# Patient Record
Sex: Female | Born: 1980 | Race: White | Hispanic: No | Marital: Married | Smoking: Former smoker
Health system: Southern US, Community
[De-identification: ages and names within clinical notes are randomized; demographics above are authoritative.]

## PROBLEM LIST (undated history)

## (undated) DIAGNOSIS — J45909 Unspecified asthma, uncomplicated: Secondary | ICD-10-CM

## (undated) DIAGNOSIS — R569 Unspecified convulsions: Secondary | ICD-10-CM

## (undated) DIAGNOSIS — Z87891 Personal history of nicotine dependence: Secondary | ICD-10-CM

## (undated) DIAGNOSIS — O021 Missed abortion: Secondary | ICD-10-CM

## (undated) DIAGNOSIS — Z973 Presence of spectacles and contact lenses: Secondary | ICD-10-CM

## (undated) DIAGNOSIS — Z8669 Personal history of other diseases of the nervous system and sense organs: Secondary | ICD-10-CM

## (undated) DIAGNOSIS — O034 Incomplete spontaneous abortion without complication: Secondary | ICD-10-CM

## (undated) DIAGNOSIS — R87629 Unspecified abnormal cytological findings in specimens from vagina: Secondary | ICD-10-CM

## (undated) DIAGNOSIS — K219 Gastro-esophageal reflux disease without esophagitis: Secondary | ICD-10-CM

## (undated) HISTORY — PX: LEEP: SHX91

## (undated) HISTORY — PX: WISDOM TOOTH EXTRACTION: SHX21

## (undated) HISTORY — DX: Incomplete spontaneous abortion without complication: O03.4

---

## 2002-08-06 ENCOUNTER — Other Ambulatory Visit: Admission: RE | Admit: 2002-08-06 | Discharge: 2002-08-06 | Payer: Self-pay | Admitting: Obstetrics and Gynecology

## 2005-12-06 ENCOUNTER — Ambulatory Visit: Payer: Self-pay | Admitting: Family Medicine

## 2006-02-28 ENCOUNTER — Ambulatory Visit: Payer: Self-pay | Admitting: Family Medicine

## 2006-09-16 ENCOUNTER — Ambulatory Visit: Payer: Self-pay | Admitting: Family Medicine

## 2009-08-18 ENCOUNTER — Ambulatory Visit (HOSPITAL_COMMUNITY): Admission: RE | Admit: 2009-08-18 | Discharge: 2009-08-18 | Payer: Self-pay | Admitting: Obstetrics and Gynecology

## 2009-11-22 ENCOUNTER — Inpatient Hospital Stay (HOSPITAL_COMMUNITY): Admission: AD | Admit: 2009-11-22 | Discharge: 2009-11-22 | Payer: Self-pay | Admitting: Obstetrics and Gynecology

## 2009-11-30 ENCOUNTER — Inpatient Hospital Stay (HOSPITAL_COMMUNITY): Admission: AD | Admit: 2009-11-30 | Discharge: 2009-11-30 | Payer: Self-pay | Admitting: Obstetrics & Gynecology

## 2009-12-02 ENCOUNTER — Ambulatory Visit: Payer: Self-pay | Admitting: Physician Assistant

## 2009-12-02 ENCOUNTER — Inpatient Hospital Stay (HOSPITAL_COMMUNITY): Admission: AD | Admit: 2009-12-02 | Discharge: 2009-12-02 | Payer: Self-pay | Admitting: Obstetrics and Gynecology

## 2009-12-04 ENCOUNTER — Ambulatory Visit (HOSPITAL_COMMUNITY): Admission: AD | Admit: 2009-12-04 | Discharge: 2009-12-04 | Payer: Self-pay | Admitting: Obstetrics and Gynecology

## 2009-12-05 ENCOUNTER — Inpatient Hospital Stay (HOSPITAL_COMMUNITY): Admission: AD | Admit: 2009-12-05 | Discharge: 2009-12-09 | Payer: Self-pay | Admitting: Obstetrics and Gynecology

## 2011-02-04 LAB — COMPREHENSIVE METABOLIC PANEL
ALT: 16 U/L (ref 0–35)
ALT: 16 U/L (ref 0–35)
ALT: 15 U/L (ref 0–35)
AST: 23 U/L (ref 0–37)
AST: 22 U/L (ref 0–37)
AST: 69 U/L — ABNORMAL HIGH (ref 0–37)
Albumin: 2.3 g/dL — ABNORMAL LOW (ref 3.5–5.2)
Albumin: 2.3 g/dL — ABNORMAL LOW (ref 3.5–5.2)
Albumin: 1.9 g/dL — ABNORMAL LOW (ref 3.5–5.2)
Albumin: 2 g/dL — ABNORMAL LOW (ref 3.5–5.2)
Alkaline Phosphatase: 207 U/L — ABNORMAL HIGH (ref 39–117)
Alkaline Phosphatase: 111 U/L (ref 39–117)
BUN: 10 mg/dL (ref 6–23)
BUN: 10 mg/dL (ref 6–23)
BUN: 13 mg/dL (ref 6–23)
BUN: 9 mg/dL (ref 6–23)
CO2: 21 mEq/L (ref 19–32)
CO2: 25 mEq/L (ref 19–32)
Calcium: 8.4 mg/dL (ref 8.4–10.5)
Calcium: 8.5 mg/dL (ref 8.4–10.5)
Chloride: 109 mEq/L (ref 96–112)
Chloride: 110 mEq/L (ref 96–112)
Creatinine, Ser: 0.74 mg/dL (ref 0.4–1.2)
Creatinine, Ser: 0.72 mg/dL (ref 0.4–1.2)
Creatinine, Ser: 0.85 mg/dL (ref 0.4–1.2)
Creatinine, Ser: 0.98 mg/dL (ref 0.4–1.2)
GFR calc Af Amer: >60 mL/min (ref >60)
GFR calc Af Amer: >60 mL/min (ref >60)
GFR calc Af Amer: >60 mL/min (ref >60)
GFR calc non Af Amer: >60 mL/min (ref >60)
GFR calc non Af Amer: >60 mL/min (ref >60)
GFR calc non Af Amer: >60 mL/min (ref >60)
Glucose, Bld: 110 mg/dL — ABNORMAL HIGH (ref 70–99)
Glucose, Bld: 75 mg/dL (ref 70–99)
Glucose, Bld: 85 mg/dL (ref 70–99)
Potassium: 4 mEq/L (ref 3.5–5.1)
Potassium: 3.8 mEq/L (ref 3.5–5.1)
Sodium: 135 mEq/L (ref 135–145)
Sodium: 136 mEq/L (ref 135–145)
Sodium: 137 mEq/L (ref 135–145)
Total Bilirubin: 0.2 mg/dL — ABNORMAL LOW (ref 0.3–1.2)
Total Bilirubin: 0.5 mg/dL (ref 0.3–1.2)
Total Protein: 5.7 g/dL — ABNORMAL LOW (ref 6.0–8.3)
Total Protein: 6.1 g/dL (ref 6.0–8.3)
Total Protein: 4.6 g/dL — ABNORMAL LOW (ref 6.0–8.3)
Total Protein: 4.8 g/dL — ABNORMAL LOW (ref 6.0–8.3)

## 2011-02-04 LAB — CBC
HCT: 23.5 % — ABNORMAL LOW (ref 36.0–46.0)
HCT: 24.6 % — ABNORMAL LOW (ref 36.0–46.0)
HCT: 31.6 % — ABNORMAL LOW (ref 36.0–46.0)
Hemoglobin: 10.6 g/dL — ABNORMAL LOW (ref 12.0–15.0)
Hemoglobin: 7.9 g/dL — ABNORMAL LOW (ref 12.0–15.0)
MCHC: 33.4 g/dL (ref 30.0–36.0)
MCHC: 33.5 g/dL (ref 30.0–36.0)
MCV: 88.4 fL (ref 78.0–100.0)
MCV: 88.8 fL (ref 78.0–100.0)
MCV: 89.4 fL (ref 78.0–100.0)
MCV: 89.6 fL (ref 78.0–100.0)
Platelets: 221 10*3/uL (ref 150–400)
Platelets: 239 10*3/uL (ref 150–400)
Platelets: 185 10*3/uL (ref 150–400)
RBC: 2.63 MIL/uL — ABNORMAL LOW (ref 3.87–5.11)
RBC: 3.56 MIL/uL — ABNORMAL LOW (ref 3.87–5.11)
RDW: 15.9 % — ABNORMAL HIGH (ref 11.5–15.5)
RDW: 16 % — ABNORMAL HIGH (ref 11.5–15.5)
RDW: 16.2 % — ABNORMAL HIGH (ref 11.5–15.5)
WBC: 10.6 10*3/uL — ABNORMAL HIGH (ref 4.0–10.5)
WBC: 6.8 10*3/uL (ref 4.0–10.5)

## 2011-02-04 LAB — URINALYSIS, ROUTINE W REFLEX MICROSCOPIC
Bilirubin Urine: NEGATIVE
Nitrite: NEGATIVE
Specific Gravity, Urine: 1.02 (ref 1.005–1.030)
Urobilinogen, UA: 0.2 mg/dL (ref 0.0–1.0)
pH: 6 (ref 5.0–8.0)

## 2011-02-04 LAB — URINE MICROSCOPIC-ADD ON

## 2011-02-04 LAB — URIC ACID: Uric Acid, Serum: 5.6 mg/dL (ref 2.4–7.0)

## 2011-02-04 LAB — RPR: RPR Ser Ql: NONREACTIVE

## 2011-02-04 LAB — LACTATE DEHYDROGENASE
LDH: 141 U/L (ref 94–250)
LDH: 149 U/L (ref 94–250)

## 2014-10-19 DIAGNOSIS — O021 Missed abortion: Secondary | ICD-10-CM

## 2014-10-19 HISTORY — DX: Missed abortion: O02.1

## 2014-11-16 ENCOUNTER — Encounter (HOSPITAL_COMMUNITY): Payer: Self-pay | Admitting: *Deleted

## 2014-11-16 NOTE — Progress Notes (Signed)
COMPLETED HX AND MED LIST WITH PT.  DUE TO WT LIMIT PER FACILITY POLICY , PT WILL NEED TO BE MOVED TO [facility].  LM AT OFFICE FOR MARY, OR SCHEDULER ABOUT POLICY AND ALSO PRE-OP ORDERS NEEDED. PT AWARE OF THIS AND CAN BE REACHED AT #[phone] WITH FURTHER INSTRUCTIONS.

## 2014-11-17 ENCOUNTER — Encounter (HOSPITAL_COMMUNITY): Payer: Self-pay | Admitting: *Deleted

## 2014-11-17 ENCOUNTER — Encounter (HOSPITAL_COMMUNITY)
Admission: RE | Disposition: A | Discharge status: Home / Self Care | Payer: Self-pay | Source: Ambulatory Visit | Attending: Obstetrics and Gynecology

## 2014-11-17 ENCOUNTER — Ambulatory Visit (HOSPITAL_COMMUNITY): Payer: BC Managed Care – PPO | Admitting: Anesthesiology

## 2014-11-17 ENCOUNTER — Ambulatory Visit (HOSPITAL_COMMUNITY)
Admission: RE | Admit: 2014-11-17 | Discharge: 2014-11-17 | Disposition: A | Discharge status: Home / Self Care | Payer: BC Managed Care – PPO | Source: Ambulatory Visit | Attending: Obstetrics and Gynecology | Admitting: Obstetrics and Gynecology

## 2014-11-17 DIAGNOSIS — O034 Incomplete spontaneous abortion without complication: Secondary | ICD-10-CM

## 2014-11-17 DIAGNOSIS — J45909 Unspecified asthma, uncomplicated: Secondary | ICD-10-CM | POA: Diagnosis not present

## 2014-11-17 DIAGNOSIS — O021 Missed abortion: Secondary | ICD-10-CM | POA: Diagnosis present

## 2014-11-17 DIAGNOSIS — K219 Gastro-esophageal reflux disease without esophagitis: Secondary | ICD-10-CM | POA: Insufficient documentation

## 2014-11-17 DIAGNOSIS — Z6841 Body Mass Index (BMI) 40.0 and over, adult: Secondary | ICD-10-CM | POA: Insufficient documentation

## 2014-11-17 HISTORY — DX: Incomplete spontaneous abortion without complication: O03.4

## 2014-11-17 HISTORY — DX: Missed abortion: O02.1

## 2014-11-17 HISTORY — DX: Gastro-esophageal reflux disease without esophagitis: K21.9

## 2014-11-17 HISTORY — DX: Presence of spectacles and contact lenses: Z97.3

## 2014-11-17 HISTORY — DX: Personal history of other diseases of the nervous system and sense organs: Z86.69

## 2014-11-17 HISTORY — PX: DILATION AND EVACUATION: SHX1459

## 2014-11-17 LAB — CBC
HEMATOCRIT: 41.3 % (ref 36.0–46.0)
HEMOGLOBIN: 13.8 g/dL (ref 12.0–15.0)
MCH: 28.8 pg (ref 26.0–34.0)
MCHC: 33.4 g/dL (ref 30.0–36.0)
MCV: 86 fL (ref 78.0–100.0)
Platelets: 232 10*3/uL (ref 150–400)
RBC: 4.8 MIL/uL (ref 3.87–5.11)
RDW: 13.5 % (ref 11.5–15.5)
WBC: 8.9 10*3/uL (ref 4.0–10.5)

## 2014-11-17 LAB — ABO/RH: ABO/RH(D): A NEG

## 2014-11-17 SURGERY — DILATION AND EVACUATION, UTERUS
Anesthesia: Choice

## 2014-11-17 SURGERY — DILATION AND EVACUATION, UTERUS
Anesthesia: General | Site: Vagina

## 2014-11-17 MED ORDER — MIDAZOLAM HCL 2 MG/2ML IJ SOLN
INTRAMUSCULAR | Status: AC
  Filled 2014-11-17: qty 2

## 2014-11-17 MED ORDER — FENTANYL CITRATE 0.05 MG/ML IJ SOLN
INTRAMUSCULAR | Status: AC
  Filled 2014-11-17: qty 2

## 2014-11-17 MED ORDER — PROPOFOL 10 MG/ML IV BOLUS
INTRAVENOUS | Status: DC | PRN
  Administered 2014-11-17: 200 mg via INTRAVENOUS

## 2014-11-17 MED ORDER — CHLOROPROCAINE HCL 1 % IJ SOLN
INTRAMUSCULAR | Status: AC
  Filled 2014-11-17: qty 30

## 2014-11-17 MED ORDER — DEXAMETHASONE SODIUM PHOSPHATE 10 MG/ML IJ SOLN
INTRAMUSCULAR | Status: DC | PRN
  Administered 2014-11-17: 10 mg via INTRAVENOUS

## 2014-11-17 MED ORDER — LIDOCAINE HCL (CARDIAC) 20 MG/ML IV SOLN
INTRAVENOUS | Status: DC | PRN
  Administered 2014-11-17: 100 mg via INTRAVENOUS

## 2014-11-17 MED ORDER — FAMOTIDINE 20 MG PO TABS
ORAL_TABLET | ORAL | Status: AC
  Administered 2014-11-17: 20 mg via ORAL
  Filled 2014-11-17: qty 1

## 2014-11-17 MED ORDER — FAMOTIDINE 20 MG PO TABS
20.0000 mg | ORAL_TABLET | Freq: Once | ORAL | Status: AC
  Administered 2014-11-17: 20 mg via ORAL

## 2014-11-17 MED ORDER — CHLOROPROCAINE HCL 1 % IJ SOLN
INTRAMUSCULAR | Status: DC | PRN
  Administered 2014-11-17: 20 mL

## 2014-11-17 MED ORDER — KETOROLAC TROMETHAMINE 30 MG/ML IJ SOLN
INTRAMUSCULAR | Status: DC | PRN
  Administered 2014-11-17: 30 mg via INTRAVENOUS

## 2014-11-17 MED ORDER — RHO D IMMUNE GLOBULIN 1500 UNIT/2ML IJ SOSY
300.0000 ug | PREFILLED_SYRINGE | Freq: Once | INTRAMUSCULAR | Status: AC
  Administered 2014-11-17: 300 ug via INTRAVENOUS
  Filled 2014-11-17: qty 2

## 2014-11-17 MED ORDER — CLINDAMYCIN PHOSPHATE 900 MG/50ML IV SOLN
900.0000 mg | Freq: Once | INTRAVENOUS | Status: AC
  Administered 2014-11-17: 900 mg via INTRAVENOUS
  Filled 2014-11-17: qty 50

## 2014-11-17 MED ORDER — CIPROFLOXACIN IN D5W 400 MG/200ML IV SOLN
400.0000 mg | INTRAVENOUS | Status: AC
  Administered 2014-11-17: 400 mg via INTRAVENOUS
  Filled 2014-11-17: qty 200

## 2014-11-17 MED ORDER — SCOPOLAMINE 1 MG/3DAYS TD PT72
MEDICATED_PATCH | TRANSDERMAL | Status: AC
  Administered 2014-11-17: 1.5 mg via TRANSDERMAL
  Filled 2014-11-17: qty 1

## 2014-11-17 MED ORDER — LACTATED RINGERS IV SOLN
INTRAVENOUS | Status: DC
  Administered 2014-11-17: 13:00:00 via INTRAVENOUS

## 2014-11-17 MED ORDER — SCOPOLAMINE 1 MG/3DAYS TD PT72
1.0000 | MEDICATED_PATCH | TRANSDERMAL | Status: DC
  Administered 2014-11-17: 1.5 mg via TRANSDERMAL

## 2014-11-17 MED ORDER — FENTANYL CITRATE 0.05 MG/ML IJ SOLN
INTRAMUSCULAR | Status: DC | PRN
  Administered 2014-11-17 (×2): 100 ug via INTRAVENOUS

## 2014-11-17 MED ORDER — OXYCODONE-ACETAMINOPHEN 7.5-325 MG PO TABS: 1.0000 | ORAL_TABLET | ORAL | Status: DC | PRN

## 2014-11-17 SURGICAL SUPPLY — 17 items
CLOTH BEACON ORANGE TIMEOUT ST (SAFETY) ×3 IMPLANT
DECANTER SPIKE VIAL GLASS SM (MISCELLANEOUS) ×3 IMPLANT
GLOVE BIO SURGEON STRL SZ7 (GLOVE) ×3 IMPLANT
GOWN STRL REUS W/TWL LRG LVL3 (GOWN DISPOSABLE) ×6 IMPLANT
KIT BERKELEY 1ST TRIMESTER 3/8 (MISCELLANEOUS) ×3 IMPLANT
NDL SPNL 20GX3.5 QUINCKE YW (NEEDLE) ×1 IMPLANT
NEEDLE SPNL 20GX3.5 QUINCKE YW (NEEDLE) ×3 IMPLANT
NS IRRIG 1000ML POUR BTL (IV SOLUTION) ×3 IMPLANT
PACK VAGINAL MINOR WOMEN LF (CUSTOM PROCEDURE TRAY) ×3 IMPLANT
PAD OB MATERNITY 4.3X12.25 (PERSONAL CARE ITEMS) ×3 IMPLANT
PAD PREP 24X48 CUFFED NSTRL (MISCELLANEOUS) ×3 IMPLANT
SET BERKELEY SUCTION TUBING (SUCTIONS) ×3 IMPLANT
TOWEL OR 17X24 6PK STRL BLUE (TOWEL DISPOSABLE) ×6 IMPLANT
VACURETTE 10 RIGID CVD (CANNULA) IMPLANT
VACURETTE 7MM CVD STRL WRAP (CANNULA) IMPLANT
VACURETTE 8 RIGID CVD (CANNULA) ×2 IMPLANT
VACURETTE 9 RIGID CVD (CANNULA) IMPLANT

## 2014-11-17 NOTE — Anesthesia Preprocedure Evaluation (Addendum)
Anesthesia Evaluation  Patient identified by MRN, date of birth, ID band Patient awake    Reviewed: Allergy & Precautions, H&P , NPO status , Patient's Chart, lab work & pertinent test results  History of Anesthesia Complications Negative for: history of anesthetic complications  Airway Mallampati: III  TM Distance: >3 FB Neck ROM: Full    Dental no notable dental hx. (+) Dental Advisory Given   Pulmonary asthma ,  breath sounds clear to auscultation  Pulmonary exam normal       Cardiovascular negative cardio ROS  Rhythm:Regular Rate:Normal     Neuro/Psych negative neurological ROS  negative psych ROS   GI/Hepatic Neg liver ROS, GERD-  Medicated and Controlled,  Endo/Other  Morbid obesity  Renal/GU negative Renal ROS  negative genitourinary   Musculoskeletal negative musculoskeletal ROS (+)   Abdominal (+) + obese,   Peds negative pediatric ROS (+)  Hematology negative hematology ROS (+)   Anesthesia Other Findings   Reproductive/Obstetrics negative OB ROS                            Anesthesia Physical Anesthesia Plan  ASA: III  Anesthesia Plan: General   Post-op Pain Management:    Induction: Intravenous  Airway Management Planned: LMA  Additional Equipment:   Intra-op Plan:   Post-operative Plan: Extubation in OR  Informed Consent: I have reviewed the patients History and Physical, chart, labs and discussed the procedure including the risks, benefits and alternatives for the proposed anesthesia with the patient or authorized representative who has indicated his/her understanding and acceptance.   Dental advisory given  Plan Discussed with: CRNA  Anesthesia Plan Comments:         Anesthesia Quick Evaluation

## 2014-11-17 NOTE — H&P (Signed)
  History and physical exam unchanged 

## 2014-11-17 NOTE — Anesthesia Procedure Notes (Signed)
Procedure Name: LMA Insertion Date/Time: 11/17/2014 1:16 PM Performed by: Adin Lariccia, Jannet AskewHARLESETTA [name] Pre-anesthesia Checklist: Patient identified, Emergency Drugs available, Suction available, Patient being monitored and Timeout performed Patient Re-evaluated:Patient Re-evaluated prior to inductionOxygen Delivery Method: Circle system utilized Preoxygenation: Pre-oxygenation with 100% oxygen Intubation Type: IV induction LMA: LMA inserted LMA Size: 4.0 Number of attempts: 1 Dental Injury: Teeth and Oropharynx as per pre-operative assessment

## 2014-11-17 NOTE — Brief Op Note (Signed)
11/17/2014  1:44 PM  PATIENT:  Veronica [name]  33 y.o. female  PRE-OPERATIVE DIAGNOSIS:  MISSED AB  POST-OPERATIVE DIAGNOSIS:  missed abortion  PROCEDURE:  Procedure(s): DILATATION AND EVACUATION (N/A)  SURGEON:  Surgeon(s) and Role:    * Juluis MireJohn S Naeemah Jasmer, MD - Primary  PHYSICIAN ASSISTANT:   ASSISTANTS: none   ANESTHESIA:   general and paracervical block  EBL:     BLOOD ADMINISTERED:none  DRAINS: none   LOCAL MEDICATIONS USED:  MARCAINE     SPECIMEN:  Source of Specimen:  products of conception  DISPOSITION OF SPECIMEN:  PATHOLOGY  COUNTS:  YES  TOURNIQUET:  * No tourniquets in log *  DICTATION: .Other Dictation: Dictation Number [id number]  PLAN OF CARE: Discharge to home after PACU  PATIENT DISPOSITION:  PACU - hemodynamically stable.   Delay start of Pharmacological VTE agent (>24hrs) due to surgical blood loss or risk of bleeding: not applicable

## 2014-11-17 NOTE — H&P (Signed)
Veronica RedderJennifer R [name] is an 33 y.o. female. Presented at the office yesterday with vaginal bleeding at 9 weeks.  Sonogram c/w nonviable fetus.  CRL c/w 6.2 weeks.  Comes in for dilation and evacuation.  Pertinent Gynecological History: Menses: regular every month without intermenstrual spotting Bleeding: na Contraception: none DES exposure: denies Blood transfusions: none Sexually transmitted diseases: no past history Previous GYN Procedures: LEEP of the cervix and prior cesearean section  Last mammogram: na Date: na  Last pap: normal Date: 2014 OB History: G2, P1   Menstrual History: Menarche age: [age]  Patient's last menstrual period was 08/28/2014 (exact date).    Past Medical History  Diagnosis Date  . History of seizures as a child     FEBRILE--  NONE SINCE  . GERD (gastroesophageal reflux disease)   . Wears glasses   . Missed ab     Past Surgical History  Procedure Laterality Date  . Cesarean section  12-05-2009    History reviewed. No pertinent family history.  Social History:  reports that she has never smoked. She has never used smokeless tobacco. She reports that she does not drink alcohol or use illicit drugs.  Allergies:  Allergies  Allergen Reactions  . Penicillins Swelling    No prescriptions prior to admission    Review of Systems  All other systems reviewed and are negative.   Height 5\' 8"  (1.727 m), weight 168.284 kg (371 lb), last menstrual period 08/28/2014. Physical Exam  Constitutional: She is oriented to person, place, and time. She appears well-developed and well-nourished.  HENT:  Head: Normocephalic.  Eyes: Conjunctivae and EOM are normal. Pupils are equal, round, and reactive to light.  Cardiovascular: Normal rate, regular rhythm and normal heart sounds.   Respiratory: Effort normal and breath sounds normal.  GI: Soft. Bowel sounds are normal.  Genitourinary:  Minimal bleeding uterus 8 weeks in size  Neurological: She is alert and  oriented to person, place, and time.    No results found for this or any previous visit (from the past 24 hour(s)).  No results found.  Assessment/Plan: Nonviable first trimester pregnancy Blood type A neg. Precede with dilation and curretage.  Risk discussed including.  Infection.  Hemorrhage that could require transfusions with the risk of AID's or hepatitis.  Excessive bleeding could require hysterectomy.  Risk of perforation with injury to adjacent organs.  This could require exploratory surgery.  Risk of DVT and PE.  Patient expresses understanding of indications and risks.  Will require RHO GAM.  Sky Primo S 11/17/2014, 8:55 AM

## 2014-11-17 NOTE — Anesthesia Postprocedure Evaluation (Signed)
  Anesthesia Post-op Note  Patient: Veronica [name]  Procedure(s) Performed: Procedure(s): DILATATION AND EVACUATION (N/A)  Patient Location: PACU  Anesthesia Type:General  Level of Consciousness: awake, alert  and oriented  Airway and Oxygen Therapy: Patient Spontanous Breathing  Post-op Pain: none  Post-op Assessment: Post-op Vital signs reviewed, Patient's Cardiovascular Status Stable, Respiratory Function Stable, Patent Airway, No signs of Nausea or vomiting and Pain level controlled  Post-op Vital Signs: Reviewed and stable  Last Vitals:  Filed Vitals:   11/17/14 1400  BP: 135/74  Pulse: 80  Temp:   Resp: 27    Complications: No apparent anesthesia complications

## 2014-11-17 NOTE — Transfer of Care (Signed)
Immediate Anesthesia Transfer of Care Note  Patient: Veronica [name]  Procedure(s) Performed: Procedure(s): DILATATION AND EVACUATION (N/A)  Patient Location: PACU  Anesthesia Type:General  Level of Consciousness: awake, alert  and oriented  Airway & Oxygen Therapy: Patient Spontanous Breathing and Patient connected to nasal cannula oxygen  Post-op Assessment: Report given to PACU RN and Post -op Vital signs reviewed and stable  Post vital signs: Reviewed and stable  Complications: No apparent anesthesia complications

## 2014-11-17 NOTE — Op Note (Signed)
NAMDorothey Baseman:  [name], Veronica            ACCOUNT NO.:  [id number]  MEDICAL RECORD NO.:  [id number]  LOCATION:  [facility]                          FACILITY:  [facility]  PHYSICIAN:  Juluis MireJohn S. Javante [name], M.D.   DATE OF BIRTH:  1981-04-01  DATE OF PROCEDURE:  11/17/2014 DATE OF DISCHARGE:  11/17/2014                              OPERATIVE REPORT   PREOPERATIVE DIAGNOSIS:  Nonviable first trimester pregnancy.  POSTOPERATIVE DIAGNOSIS:  Nonviable first trimester pregnancy.  OPERATIVE PROCEDURE:  Paracervical block with dilation and evacuation.  SURGEON:  Juluis MireJohn S. Elai [name], M.D.  ANESTHESIA:  Paracervical block with general.  BLOOD LOSS:  Minimal.  PACKS AND DRAINS:  None.  INTRAOPERATIVE BLOOD PLACED:  None.  COMPLICATIONS:  None.  INDICATIONS:  Dictated in history and physical.  PROCEDURE IN DETAIL:  As follows; the patient was taken to OR and placed supine position.  After satisfactory level of general anesthesia obtained, the patient was placed in the dorsal lithotomy position in the [place] stirrups.  Perineum and vagina were prepped out with Betadine and draped in sterile field.  A speculum was placed in vaginal vault.  The cervix was grasped with single-tooth tenaculum.  Paracervical block of 1% Nesacaine was instituted.  Uterus sounded to approximately 10 cm. Cervix serially dilated to a size 27 Pratt dilator.  Size 8 curved suction curette was introduced.  Intrauterine contents were removed using suction curetting.  This was continued until no additional tissue was obtained.  We then sharply curetted.  Repeat suction curetting obtained additional tissue and repeated sharp curetting and felt that all areas of the uterus were clear by its gritty feel.  Repeat suction curetting revealed no additional tissue.  The uterus was contracting down well.  Bleeding was minimal.  There were no signs of perforation.  At this point in time, single-tooth tenaculum and speculum then removed.  The patient was  taken out the dorsal lithotomy position.  Once alert and extubated, transferred to recovery room in good condition.  Sponge, instrument, and needle count were correct by circulating nurse.     Juluis MireJohn S. [name], M.D.     JSM/MEDQ  D:  11/17/2014  T:  11/17/2014  Job:  [id number]

## 2014-11-17 NOTE — Discharge Instructions (Signed)

## 2014-11-18 ENCOUNTER — Encounter (HOSPITAL_COMMUNITY): Payer: Self-pay | Admitting: Obstetrics and Gynecology

## 2014-11-18 LAB — RH IG WORKUP (INCLUDES ABO/RH)
ABO/RH(D): A NEG
Antibody Screen: NEGATIVE
Gestational Age(Wks): 9
Unit division: 0

## 2015-02-24 LAB — OB RESULTS CONSOLE HEPATITIS B SURFACE ANTIGEN: HEP B S AG: NEGATIVE

## 2015-02-24 LAB — OB RESULTS CONSOLE RPR
RPR: NONREACTIVE
RPR: NONREACTIVE

## 2015-02-24 LAB — OB RESULTS CONSOLE ABO/RH: RH TYPE: NEGATIVE

## 2015-02-24 LAB — OB RESULTS CONSOLE HIV ANTIBODY (ROUTINE TESTING)
HIV: NONREACTIVE
HIV: NONREACTIVE

## 2015-02-24 LAB — OB RESULTS CONSOLE GC/CHLAMYDIA
CHLAMYDIA, DNA PROBE: NEGATIVE
Chlamydia: NEGATIVE
Gonorrhea: NEGATIVE
Gonorrhea: NEGATIVE

## 2015-02-24 LAB — OB RESULTS CONSOLE ANTIBODY SCREEN: Antibody Screen: NEGATIVE

## 2015-02-24 LAB — OB RESULTS CONSOLE RUBELLA ANTIBODY, IGM: RUBELLA: IMMUNE

## 2015-03-09 ENCOUNTER — Other Ambulatory Visit: Payer: Self-pay | Admitting: Obstetrics and Gynecology

## 2015-03-10 LAB — CYTOLOGY - PAP

## 2015-09-21 NOTE — H&P (Signed)
Veronica RedderJennifer R [name] is a 34 y.o. female G3 P1 @ 30+ weeks presenting for repeat c-section and steriliztion . History OB History    Gravida Para Term Preterm AB TAB SAB Ectopic Multiple Living   1              Past Medical History  Diagnosis Date  . History of seizures as a child     FEBRILE--  NONE SINCE  . GERD (gastroesophageal reflux disease)   . Wears glasses   . Missed ab    Past Surgical History  Procedure Laterality Date  . Cesarean section  12-05-2009  . Dilation and evacuation N/A 11/17/2014    Procedure: DILATATION AND EVACUATION;  Surgeon: Juluis MireJohn S McComb, MD;  Location: [facility];  Service: Gynecology;  Laterality: N/A;   Family History: family history is not on file. Social History:  reports that she has never smoked. She has never used smokeless tobacco. She reports that she does not drink alcohol or use illicit drugs.   Prenatal Transfer Tool  Maternal Diabetes: No Genetic Screening: Declined Maternal Ultrasounds/Referrals: Normal Fetal Ultrasounds or other Referrals:  None Maternal Substance Abuse:  No Significant Maternal Medications:  None Significant Maternal Lab Results:  None Other Comments:  None  ROS    Last menstrual period 08/28/2014. Exam Physical Exam  Prenatal labs: ABO, Rh: --/--/A NEG, A NEG (12/30 1430) Antibody: NEG (12/30 1430) Rubella:   RPR:    HBsAg:    HIV:    GBS:     Assessment/Plan: Previous c-section.  Desires rpt with sterilzation.  Plan for rpt c-section w/ BPS   Natan Hartog 09/21/2015, 1:32 PM

## 2015-09-30 ENCOUNTER — Encounter (HOSPITAL_COMMUNITY): Payer: Self-pay

## 2015-09-30 ENCOUNTER — Encounter (HOSPITAL_COMMUNITY)
Admission: AD | Disposition: A | Discharge status: Home / Self Care | Payer: Self-pay | Source: Ambulatory Visit | Attending: Obstetrics and Gynecology

## 2015-09-30 ENCOUNTER — Encounter (HOSPITAL_COMMUNITY): Payer: Self-pay | Admitting: *Deleted

## 2015-09-30 ENCOUNTER — Inpatient Hospital Stay (HOSPITAL_COMMUNITY)
Admission: AD | Admit: 2015-09-30 | Discharge: 2015-10-02 | DRG: 765 | Disposition: A | Discharge status: Home / Self Care | Payer: BLUE CROSS/BLUE SHIELD | Source: Ambulatory Visit | Attending: Obstetrics and Gynecology | Admitting: Obstetrics and Gynecology

## 2015-09-30 ENCOUNTER — Inpatient Hospital Stay (HOSPITAL_COMMUNITY): Payer: BLUE CROSS/BLUE SHIELD | Admitting: Anesthesiology

## 2015-09-30 DIAGNOSIS — O9952 Diseases of the respiratory system complicating childbirth: Secondary | ICD-10-CM | POA: Diagnosis present

## 2015-09-30 DIAGNOSIS — Z302 Encounter for sterilization: Secondary | ICD-10-CM | POA: Diagnosis not present

## 2015-09-30 DIAGNOSIS — Z8249 Family history of ischemic heart disease and other diseases of the circulatory system: Secondary | ICD-10-CM | POA: Diagnosis not present

## 2015-09-30 DIAGNOSIS — Z87891 Personal history of nicotine dependence: Secondary | ICD-10-CM | POA: Diagnosis not present

## 2015-09-30 DIAGNOSIS — O99214 Obesity complicating childbirth: Secondary | ICD-10-CM | POA: Diagnosis present

## 2015-09-30 DIAGNOSIS — Z3A38 38 weeks gestation of pregnancy: Secondary | ICD-10-CM

## 2015-09-30 DIAGNOSIS — J45909 Unspecified asthma, uncomplicated: Secondary | ICD-10-CM | POA: Diagnosis present

## 2015-09-30 DIAGNOSIS — Z6841 Body Mass Index (BMI) 40.0 and over, adult: Secondary | ICD-10-CM

## 2015-09-30 DIAGNOSIS — Z822 Family history of deafness and hearing loss: Secondary | ICD-10-CM | POA: Diagnosis not present

## 2015-09-30 DIAGNOSIS — Z809 Family history of malignant neoplasm, unspecified: Secondary | ICD-10-CM

## 2015-09-30 DIAGNOSIS — Z833 Family history of diabetes mellitus: Secondary | ICD-10-CM | POA: Diagnosis not present

## 2015-09-30 DIAGNOSIS — Z98891 History of uterine scar from previous surgery: Secondary | ICD-10-CM

## 2015-09-30 DIAGNOSIS — O34211 Maternal care for low transverse scar from previous cesarean delivery: Secondary | ICD-10-CM | POA: Diagnosis present

## 2015-09-30 HISTORY — DX: Unspecified convulsions: R56.9

## 2015-09-30 HISTORY — DX: Unspecified abnormal cytological findings in specimens from vagina: R87.629

## 2015-09-30 HISTORY — DX: Personal history of nicotine dependence: Z87.891

## 2015-09-30 HISTORY — DX: Unspecified asthma, uncomplicated: J45.909

## 2015-09-30 LAB — CBC
HEMATOCRIT: 36.1 % (ref 36.0–46.0)
Hemoglobin: 11.6 g/dL — ABNORMAL LOW (ref 12.0–15.0)
MCH: 28.6 pg (ref 26.0–34.0)
MCHC: 32.1 g/dL (ref 30.0–36.0)
MCV: 89.1 fL (ref 78.0–100.0)
PLATELETS: 213 10*3/uL (ref 150–400)
RBC: 4.05 MIL/uL (ref 3.87–5.11)
RDW: 14.9 % (ref 11.5–15.5)
WBC: 14 10*3/uL — ABNORMAL HIGH (ref 4.0–10.5)

## 2015-09-30 LAB — TYPE AND SCREEN
ABO/RH(D): A NEG
Antibody Screen: NEGATIVE

## 2015-09-30 SURGERY — Surgical Case
Anesthesia: Spinal

## 2015-09-30 MED ORDER — KETOROLAC TROMETHAMINE 30 MG/ML IJ SOLN: 30.0000 mg | Freq: Once | INTRAMUSCULAR | Status: AC | PRN

## 2015-09-30 MED ORDER — BUPIVACAINE IN DEXTROSE 0.75-8.25 % IT SOLN
INTRATHECAL | Status: DC | PRN
  Administered 2015-09-30: 1.2 mL via INTRATHECAL

## 2015-09-30 MED ORDER — OXYTOCIN 10 UNIT/ML IJ SOLN
40.0000 [IU] | INTRAVENOUS | Status: DC | PRN
  Administered 2015-09-30: 40 [IU] via INTRAVENOUS

## 2015-09-30 MED ORDER — ONDANSETRON HCL 4 MG/2ML IJ SOLN
INTRAMUSCULAR | Status: DC | PRN
Start: 2015-09-30 — End: 2015-09-30
  Administered 2015-09-30: 4 mg via INTRAVENOUS

## 2015-09-30 MED ORDER — MEPERIDINE HCL 25 MG/ML IJ SOLN: 6.2500 mg | INTRAMUSCULAR | Status: DC | PRN

## 2015-09-30 MED ORDER — BUPIVACAINE IN DEXTROSE 0.75-8.25 % IT SOLN
INTRATHECAL | Status: AC
  Filled 2015-09-30: qty 2

## 2015-09-30 MED ORDER — DEXAMETHASONE SODIUM PHOSPHATE 4 MG/ML IJ SOLN
INTRAMUSCULAR | Status: DC | PRN
  Administered 2015-09-30: 4 mg via INTRAVENOUS

## 2015-09-30 MED ORDER — PHENYLEPHRINE 8 MG IN D5W 100 ML (0.08MG/ML) PREMIX OPTIME
INJECTION | INTRAVENOUS | Status: DC | PRN
  Administered 2015-09-30: 60 ug/min via INTRAVENOUS

## 2015-09-30 MED ORDER — SCOPOLAMINE 1 MG/3DAYS TD PT72
MEDICATED_PATCH | TRANSDERMAL | Status: AC
  Filled 2015-09-30: qty 1

## 2015-09-30 MED ORDER — FENTANYL CITRATE (PF) 100 MCG/2ML IJ SOLN
INTRAMUSCULAR | Status: DC | PRN
  Administered 2015-09-30: 100 ug via EPIDURAL

## 2015-09-30 MED ORDER — PROMETHAZINE HCL 25 MG/ML IJ SOLN: 6.2500 mg | INTRAMUSCULAR | Status: DC | PRN

## 2015-09-30 MED ORDER — FAMOTIDINE IN NACL 20-0.9 MG/50ML-% IV SOLN
20.0000 mg | Freq: Once | INTRAVENOUS | Status: AC
  Administered 2015-09-30: 20 mg via INTRAVENOUS
  Filled 2015-09-30: qty 50

## 2015-09-30 MED ORDER — PHENYLEPHRINE 8 MG IN D5W 100 ML (0.08MG/ML) PREMIX OPTIME
INJECTION | INTRAVENOUS | Status: AC
  Filled 2015-09-30: qty 100

## 2015-09-30 MED ORDER — ALBUTEROL SULFATE HFA 108 (90 BASE) MCG/ACT IN AERS
1.0000 | INHALATION_SPRAY | Freq: Once | RESPIRATORY_TRACT | Status: AC
  Administered 2015-09-30: 2 via RESPIRATORY_TRACT
  Filled 2015-09-30: qty 6.7

## 2015-09-30 MED ORDER — LACTATED RINGERS IV SOLN
INTRAVENOUS | Status: DC | PRN
  Administered 2015-09-30 (×2): via INTRAVENOUS

## 2015-09-30 MED ORDER — KETOROLAC TROMETHAMINE 30 MG/ML IJ SOLN
INTRAMUSCULAR | Status: AC
  Administered 2015-10-01: 30 mg via INTRAMUSCULAR
  Filled 2015-09-30: qty 1

## 2015-09-30 MED ORDER — DEXAMETHASONE SODIUM PHOSPHATE 4 MG/ML IJ SOLN
INTRAMUSCULAR | Status: AC
  Filled 2015-09-30: qty 1

## 2015-09-30 MED ORDER — ALBUTEROL SULFATE HFA 108 (90 BASE) MCG/ACT IN AERS
INHALATION_SPRAY | RESPIRATORY_TRACT | Status: AC
  Filled 2015-09-30: qty 6.7

## 2015-09-30 MED ORDER — MORPHINE SULFATE (PF) 0.5 MG/ML IJ SOLN
INTRAMUSCULAR | Status: AC
  Filled 2015-09-30: qty 100

## 2015-09-30 MED ORDER — FENTANYL CITRATE (PF) 100 MCG/2ML IJ SOLN
INTRAMUSCULAR | Status: AC
  Filled 2015-09-30: qty 4

## 2015-09-30 MED ORDER — ONDANSETRON HCL 4 MG/2ML IJ SOLN
4.0000 mg | Freq: Once | INTRAMUSCULAR | Status: AC
  Administered 2015-09-30: 4 mg via INTRAVENOUS
  Filled 2015-09-30: qty 2

## 2015-09-30 MED ORDER — KETOROLAC TROMETHAMINE 30 MG/ML IJ SOLN: 30.0000 mg | Freq: Four times a day (QID) | INTRAMUSCULAR | Status: DC | PRN

## 2015-09-30 MED ORDER — GENTAMICIN SULFATE 40 MG/ML IJ SOLN
INTRAVENOUS | Status: AC
  Administered 2015-09-30: 120.5 mL via INTRAVENOUS
  Filled 2015-09-30: qty 14.5

## 2015-09-30 MED ORDER — HYDROMORPHONE HCL 1 MG/ML IJ SOLN: 0.2500 mg | INTRAMUSCULAR | Status: DC | PRN

## 2015-09-30 MED ORDER — SCOPOLAMINE 1 MG/3DAYS TD PT72
MEDICATED_PATCH | TRANSDERMAL | Status: DC | PRN
  Administered 2015-09-30: 1 via TRANSDERMAL

## 2015-09-30 MED ORDER — OXYTOCIN 10 UNIT/ML IJ SOLN
INTRAMUSCULAR | Status: AC
  Filled 2015-09-30: qty 4

## 2015-09-30 MED ORDER — CITRIC ACID-SODIUM CITRATE 334-500 MG/5ML PO SOLN
30.0000 mL | Freq: Once | ORAL | Status: AC
  Administered 2015-09-30: 30 mL via ORAL
  Filled 2015-09-30: qty 15

## 2015-09-30 MED ORDER — MORPHINE SULFATE (PF) 0.5 MG/ML IJ SOLN
INTRAMUSCULAR | Status: DC | PRN
  Administered 2015-09-30: 4 mg via EPIDURAL

## 2015-09-30 MED ORDER — ONDANSETRON HCL 4 MG/2ML IJ SOLN
INTRAMUSCULAR | Status: AC
Start: 2015-09-30 — End: 2015-09-30
  Filled 2015-09-30: qty 2

## 2015-09-30 MED ORDER — KETOROLAC TROMETHAMINE 30 MG/ML IJ SOLN
30.0000 mg | Freq: Four times a day (QID) | INTRAMUSCULAR | Status: DC | PRN
  Administered 2015-10-01: 30 mg via INTRAMUSCULAR

## 2015-09-30 SURGICAL SUPPLY — 38 items
APL SKNCLS STERI-STRIP NONHPOA (GAUZE/BANDAGES/DRESSINGS)
APPLIER CLIP ROT 10 11.4 M/L (STAPLE) ×2
APR CLP MED LRG 11.4X10 (STAPLE) ×1
BENZOIN TINCTURE PRP APPL 2/3 (GAUZE/BANDAGES/DRESSINGS) IMPLANT
CLAMP CORD UMBIL (MISCELLANEOUS) IMPLANT
CLIP APPLIE ROT 10 11.4 M/L (STAPLE) IMPLANT
CLOTH BEACON ORANGE TIMEOUT ST (SAFETY) ×2 IMPLANT
CONTAINER PREFILL 10% NBF 15ML (MISCELLANEOUS) IMPLANT
DRAPE SHEET LG 3/4 BI-LAMINATE (DRAPES) IMPLANT
DRSG OPSITE POSTOP 4X10 (GAUZE/BANDAGES/DRESSINGS) ×2 IMPLANT
DURAPREP 26ML APPLICATOR (WOUND CARE) ×2 IMPLANT
ELECT REM PT RETURN 9FT ADLT (ELECTROSURGICAL) ×2
ELECTRODE REM PT RTRN 9FT ADLT (ELECTROSURGICAL) ×1 IMPLANT
EXTRACTOR VACUUM M CUP 4 TUBE (SUCTIONS) IMPLANT
GLOVE BIO SURGEON STRL SZ8 (GLOVE) ×2 IMPLANT
GLOVE BIOGEL PI IND STRL 7.0 (GLOVE) ×1 IMPLANT
GLOVE BIOGEL PI INDICATOR 7.0 (GLOVE) ×1
GOWN STRL REUS W/TWL LRG LVL3 (GOWN DISPOSABLE) ×4 IMPLANT
KIT ABG SYR 3ML LUER SLIP (SYRINGE) ×2 IMPLANT
LIQUID BAND (GAUZE/BANDAGES/DRESSINGS) IMPLANT
NDL HYPO 25X5/8 SAFETYGLIDE (NEEDLE) ×1 IMPLANT
NEEDLE HYPO 25X5/8 SAFETYGLIDE (NEEDLE) ×2 IMPLANT
NS IRRIG 1000ML POUR BTL (IV SOLUTION) ×2 IMPLANT
PACK C SECTION WH (CUSTOM PROCEDURE TRAY) ×2 IMPLANT
PAD OB MATERNITY 4.3X12.25 (PERSONAL CARE ITEMS) ×2 IMPLANT
PENCIL SMOKE EVAC W/HOLSTER (ELECTROSURGICAL) ×2 IMPLANT
STRIP CLOSURE SKIN 1/2X4 (GAUZE/BANDAGES/DRESSINGS) IMPLANT
SUT MNCRL 0 VIOLET CTX 36 (SUTURE) ×4 IMPLANT
SUT MONOCRYL 0 CTX 36 (SUTURE) ×4
SUT PDS AB 0 CTX 60 (SUTURE) ×2 IMPLANT
SUT PLAIN 0 NONE (SUTURE) IMPLANT
SUT PLAIN 2 0 (SUTURE)
SUT PLAIN 2 0 XLH (SUTURE) IMPLANT
SUT PLAIN ABS 2-0 CT1 27XMFL (SUTURE) IMPLANT
SUT VIC AB 4-0 KS 27 (SUTURE) ×2 IMPLANT
SYR 30ML LL (SYRINGE) ×2 IMPLANT
TOWEL OR 17X24 6PK STRL BLUE (TOWEL DISPOSABLE) ×2 IMPLANT
TRAY FOLEY CATH SILVER 14FR (SET/KITS/TRAYS/PACK) ×2 IMPLANT

## 2015-09-30 NOTE — Anesthesia Postprocedure Evaluation (Signed)
Anesthesia Post Note  Patient: Veronica RedderJennifer R [name]  Procedure(s) Performed: Procedure(s) (LRB): CESAREAN SECTION (N/A)  Anesthesia type: Spinal  Patient location: PACU  Post pain: Pain level controlled  Post assessment: Post-op Vital signs reviewed  Last Vitals:  Filed Vitals:   09/30/15 2317  BP:   Pulse: 86  Temp:   Resp: 18    Post vital signs: Reviewed  Level of consciousness: awake  Complications: No apparent anesthesia complications

## 2015-09-30 NOTE — Anesthesia Preprocedure Evaluation (Addendum)
Anesthesia Evaluation  Patient identified by MRN, date of birth, ID band Patient awake    Reviewed: Allergy & Precautions, H&P , NPO status , Patient's Chart, lab work & pertinent test results  Airway Mallampati: III  TM Distance: >3 FB Neck ROM: full    Dental   Pulmonary asthma , former smoker,  Will get an albuterol inhaler prior to OR.   Pulmonary exam normal        Cardiovascular Normal cardiovascular exam     Neuro/Psych negative psych ROS   GI/Hepatic Neg liver ROS,   Endo/Other  Morbid obesity  Renal/GU negative Renal ROS     Musculoskeletal   Abdominal (+) + obese,   Peds  Hematology negative hematology ROS (+)   Anesthesia Other Findings   Reproductive/Obstetrics (+) Pregnancy                            Anesthesia Physical Anesthesia Plan  ASA: III  Anesthesia Plan: Combined Spinal and Epidural   Post-op Pain Management:    Induction:   Airway Management Planned:   Additional Equipment:   Intra-op Plan:   Post-operative Plan:   Informed Consent: I have reviewed the patients History and Physical, chart, labs and discussed the procedure including the risks, benefits and alternatives for the proposed anesthesia with the patient or authorized representative who has indicated his/her understanding and acceptance.     Plan Discussed with: CRNA and Surgeon  Anesthesia Plan Comments:        Anesthesia Quick Evaluation

## 2015-09-30 NOTE — Consult Note (Signed)
[facility]  Delivery Note:  C-section       09/30/2015  10:11 PM  I was called to the operating room at the request of the patient's obstetrician (Dr. Henderson Cloudomblin) for a repeat c-section.  PRENATAL HX:  This is a 34 y/o G3P1011 at 5938 and 6/[redacted] weeks gestation who was admitted in active labor.  Her pregnancy has been uncomplicated.    INTRAPARTUM HX:   Repeat c-section with AROM at delivery  DELIVERY:  Infant was vigorous at delivery, requiring no resuscitation other than standard warming, drying and stimulation.  APGARs 8 and 9.  Exam within normal limits.  After 5 minutes, baby left with nurse to assist parents with skin-to-skin care.   _____________________ Electronically Signed By: Maryan CharLindsey Elzie Knisley, MD Neonatologist

## 2015-09-30 NOTE — Brief Op Note (Signed)
09/30/2015  10:42 PM  PATIENT:  Veronica [name]  34 y.o. female  PRE-OPERATIVE DIAGNOSIS:  Previous cesarean section desires repeat, desires permanent sterilization  POST-OPERATIVE DIAGNOSIS:  Previous cesarean section desires repeat, desires permanent sterilization  PROCEDURE:  Procedure(s): CESAREAN SECTION (N/A)  SURGEON:  Surgeon(s) and Role:    * Veronica HedgeJames Jaxie Racanelli, MD - Primary  PHYSICIAN ASSISTANT:   ASSISTANTS: Turner DanielsLuke Eure MD   ANESTHESIA:   epidural and spinal  EBL:  Total I/O In: 2000 [I.V.:2000] Out: 900 [Urine:100; Blood:800]  BLOOD ADMINISTERED:none  DRAINS: Urinary Catheter (Foley)   LOCAL MEDICATIONS USED:  NONE  SPECIMEN:  Source of Specimen:  bilateral fallopian tube segments  DISPOSITION OF SPECIMEN:  PATHOLOGY  COUNTS:  YES  TOURNIQUET:  * No tourniquets in log *  DICTATION: .Other Dictation: Dictation Number [id number]  PLAN OF CARE: Admit to inpatient   PATIENT DISPOSITION:  PACU - hemodynamically stable.   Delay start of Pharmacological VTE agent (>24hrs) due to surgical blood loss or risk of bleeding: not applicable

## 2015-09-30 NOTE — Transfer of Care (Signed)
Immediate Anesthesia Transfer of Care Note  Patient: Veronica [name]  Procedure(s) Performed: Procedure(s): CESAREAN SECTION (N/A)  Patient Location: PACU  Anesthesia Type:Spinal  Level of Consciousness: awake, alert  and oriented  Airway & Oxygen Therapy: Patient Spontanous Breathing  Post-op Assessment: Report given to RN and Post -op Vital signs reviewed and stable  Post vital signs: Reviewed and stable  Last Vitals:  Filed Vitals:   09/30/15 1848  BP: 136/82  Pulse: 102  Temp: 36.6 C  Resp: 20    Complications: No apparent anesthesia complications

## 2015-09-30 NOTE — Anesthesia Procedure Notes (Signed)
Spinal Patient location during procedure: OR Start time: 09/30/2015 9:38 PM End time: 09/30/2015 9:42 PM Staffing Anesthesiologist: Leilani AbleHATCHETT, Graylon Amory Performed by: anesthesiologist  Preanesthetic Checklist Completed: patient identified, surgical consent, pre-op evaluation, timeout performed, IV checked, risks and benefits discussed and monitors and equipment checked Spinal Block Patient position: sitting Prep: site prepped and draped and DuraPrep Patient monitoring: cardiac monitor, continuous pulse ox, blood pressure and heart rate Approach: midline Location: L3-4 Injection technique: catheter Needle Needle type: Tuohy and Sprotte  Needle gauge: 24 G Needle length: 12.7 cm Needle insertion depth: 9 cm Catheter type: closed end flexible Catheter size: 19 g Catheter at skin depth: 14 cm Assessment Sensory level: T4

## 2015-09-30 NOTE — Transfer of Care (Deleted)
Immediate Anesthesia Transfer of Care Note  Patient: Veronica RedderJennifer R Panos  Procedure(s) Performed: Procedure(s): CESAREAN SECTION (N/A)  Patient Location: PACU  Anesthesia Type:Spinal  Level of Consciousness: awake, alert  and oriented  Airway & Oxygen Therapy: Patient Spontanous Breathing  Post-op Assessment: Report given to RN  Post vital signs: Reviewed and stable  Last Vitals:  Filed Vitals:   09/30/15 1848  BP: 136/82  Pulse: 102  Temp: 36.6 C  Resp: 20    Complications: No apparent anesthesia complications

## 2015-09-30 NOTE — MAU Note (Signed)
Pt assisted from car to bathroom via wc.  Had thrown up all over self.pt is contracting, every 5-6 min, started at 1500.

## 2015-09-30 NOTE — Progress Notes (Signed)
Notified of pt arrival in MAU and cervical exam. Will review chart and call back

## 2015-09-30 NOTE — H&P (Signed)
Veronica RedderJennifer R [name] is a 34 y.o. female presenting for UCs and onset of nausea and vomiting this evening. No bleeding or leaking. Previous cesarean section for repeat. Also desires permanent sterilization. Maternal Medical History:  Reason for admission: Contractions and nausea.   Contractions: Onset was 3-5 hours ago.    Fetal activity: Perceived fetal activity is normal.      OB History    Gravida Para Term Preterm AB TAB SAB Ectopic Multiple Living   3 1 1  1  1   2      Past Medical History  Diagnosis Date  . History of seizures as a child     FEBRILE--  NONE SINCE  . GERD (gastroesophageal reflux disease)   . Wears glasses   . Missed ab   . Seizures (HCC)     febrile as child  . Asthma   . Vaginal Pap smear, abnormal     ok since LEEP   Past Surgical History  Procedure Laterality Date  . Cesarean section  12-05-2009  . Dilation and evacuation N/A 11/17/2014    Procedure: DILATATION AND EVACUATION;  Surgeon: Veronica MireJohn S McComb, MD;  Location: [facility];  Service: Gynecology;  Laterality: N/A;  . Wisdom tooth extraction    . Leep     Family History: family history includes Cancer in her maternal grandmother, mother, and paternal grandmother; Diabetes in her maternal grandfather; Hearing loss in her father; Hyperlipidemia in her father; Hypertension in her mother. Social History:  reports that she has quit smoking. She has never used smokeless tobacco. She reports that she does not drink alcohol or use illicit drugs.   Prenatal Transfer Tool  Maternal Diabetes: No Genetic Screening: Normal Maternal Ultrasounds/Referrals: Normal Fetal Ultrasounds or other Referrals:  None Maternal Substance Abuse:  No Significant Maternal Medications:  None Significant Maternal Lab Results:  None Other Comments:  None  Review of Systems  Eyes: Negative for blurred vision.  Gastrointestinal: Positive for nausea and vomiting. Negative for abdominal pain.  Neurological: Negative for  headaches.    Dilation: Closed Effacement (%): Thick Station: Ballotable Exam by:: Veronica [name], pulse 102, temperature 97.9 F (36.6 C), temperature source Oral, resp. rate 20, SpO2 99 %. Maternal Exam:  Uterine Assessment: Contraction strength is moderate.  Contraction frequency is irregular.   Abdomen: Patient reports no abdominal tenderness.   Fetal Exam Fetal State Assessment: Category I - tracings are normal.     Physical Exam  Cardiovascular: Normal rate and regular rhythm.   Respiratory: Effort normal and breath sounds normal.  GI: Soft.  Neurological: She has normal reflexes.    Prenatal labs: ABO, Rh: A/Negative/-- (04/07 0000) Antibody: Negative (04/07 0000) Rubella: Immune (04/07 0000) RPR: Nonreactive, Nonreactive (04/07 0000)  HBsAg: Negative (04/07 0000)  HIV: Non-reactive, Non-reactive (04/07 0000)  GBS:     Assessment/Plan: 34 yo G3P1 @ 38 6/7 weeks in early labor D/W patient repeat cesarean section and BTL. D/W risks including infection, organ damage, bleeding/transfusion-HIV/Hep, DVT/PE, pneumonia. D/W permanence of BTL, alternatives, failure rate and increased ectopic risk. Discussed that these risks are increased by obesity. Patient states she understands and agrees   Veronica [name],Veronica [name] 09/30/2015, 8:16 PM

## 2015-09-30 NOTE — Patient Instructions (Signed)
Your procedure is scheduled on:  Tuesday, NOV. 15, 2016  Enter through the [facility] at: 6:00 AM  Pick up the phone at the desk and dial [phone].  Call this number if you have problems the morning of surgery: [phone].  Remember: Do NOT eat food or drink after: Midnight Monday Take these medicines the morning of surgery with a SIP OF WATER: none  *Bring asthma inhaler day of surgery  Do NOT wear jewelry (body piercing), metal hair clips/bobby pins, or nail polish. Do NOT wear lotions, powders, or perfumes.  You may wear deoderant. Do NOT shave for 48 hours prior to surgery. Do NOT bring valuables to the hospital. Leave suitcase in car.  After surgery it may be brought to your room.  For patients admitted to the hospital, checkout time is 11:00 AM the day of discharge.

## 2015-10-01 ENCOUNTER — Encounter (HOSPITAL_COMMUNITY): Payer: Self-pay

## 2015-10-01 DIAGNOSIS — Z98891 History of uterine scar from previous surgery: Secondary | ICD-10-CM

## 2015-10-01 LAB — CBC
HEMATOCRIT: 31.3 % — AB (ref 36.0–46.0)
HEMOGLOBIN: 10.2 g/dL — AB (ref 12.0–15.0)
MCH: 29 pg (ref 26.0–34.0)
MCHC: 32.6 g/dL (ref 30.0–36.0)
MCV: 88.9 fL (ref 78.0–100.0)
Platelets: 197 10*3/uL (ref 150–400)
RBC: 3.52 MIL/uL — AB (ref 3.87–5.11)
RDW: 14.8 % (ref 11.5–15.5)
WBC: 9.4 10*3/uL (ref 4.0–10.5)

## 2015-10-01 MED ORDER — LACTATED RINGERS IV SOLN: INTRAVENOUS | Status: DC

## 2015-10-01 MED ORDER — ALBUTEROL SULFATE (2.5 MG/3ML) 0.083% IN NEBU: 3.0000 mL | INHALATION_SOLUTION | Freq: Four times a day (QID) | RESPIRATORY_TRACT | Status: DC | PRN

## 2015-10-01 MED ORDER — ZOLPIDEM TARTRATE 5 MG PO TABS: 5.0000 mg | ORAL_TABLET | Freq: Every evening | ORAL | Status: DC | PRN

## 2015-10-01 MED ORDER — SCOPOLAMINE 1 MG/3DAYS TD PT72
1.0000 | MEDICATED_PATCH | Freq: Once | TRANSDERMAL | Status: DC
  Filled 2015-10-01: qty 1

## 2015-10-01 MED ORDER — NALOXONE HCL 0.4 MG/ML IJ SOLN: 0.4000 mg | INTRAMUSCULAR | Status: DC | PRN

## 2015-10-01 MED ORDER — SIMETHICONE 80 MG PO CHEW
80.0000 mg | CHEWABLE_TABLET | Freq: Three times a day (TID) | ORAL | Status: DC
  Administered 2015-10-01 – 2015-10-02 (×3): 80 mg via ORAL
  Filled 2015-10-01 (×3): qty 1

## 2015-10-01 MED ORDER — NALBUPHINE HCL 10 MG/ML IJ SOLN: 5.0000 mg | INTRAMUSCULAR | Status: DC | PRN

## 2015-10-01 MED ORDER — DIBUCAINE 1 % RE OINT: 1.0000 "application " | TOPICAL_OINTMENT | RECTAL | Status: DC | PRN

## 2015-10-01 MED ORDER — NALOXONE HCL 2 MG/2ML IJ SOSY
1.0000 ug/kg/h | PREFILLED_SYRINGE | INTRAVENOUS | Status: DC | PRN
  Filled 2015-10-01: qty 2

## 2015-10-01 MED ORDER — ONDANSETRON HCL 4 MG/2ML IJ SOLN
4.0000 mg | Freq: Three times a day (TID) | INTRAMUSCULAR | Status: DC | PRN
  Filled 2015-10-01: qty 2

## 2015-10-01 MED ORDER — IBUPROFEN 600 MG PO TABS
600.0000 mg | ORAL_TABLET | Freq: Four times a day (QID) | ORAL | Status: DC
  Administered 2015-10-01 – 2015-10-02 (×4): 600 mg via ORAL
  Filled 2015-10-01 (×4): qty 1

## 2015-10-01 MED ORDER — LACTATED RINGERS IV BOLUS (SEPSIS): 250.0000 mL | Freq: Once | INTRAVENOUS | Status: DC

## 2015-10-01 MED ORDER — SENNOSIDES-DOCUSATE SODIUM 8.6-50 MG PO TABS
2.0000 | ORAL_TABLET | ORAL | Status: DC
  Administered 2015-10-01: 2 via ORAL
  Filled 2015-10-01: qty 2

## 2015-10-01 MED ORDER — DIPHENHYDRAMINE HCL 50 MG/ML IJ SOLN: 12.5000 mg | INTRAMUSCULAR | Status: DC | PRN

## 2015-10-01 MED ORDER — DIPHENHYDRAMINE HCL 25 MG PO CAPS: 25.0000 mg | ORAL_CAPSULE | ORAL | Status: DC | PRN

## 2015-10-01 MED ORDER — IBUPROFEN 600 MG PO TABS: 600.0000 mg | ORAL_TABLET | Freq: Four times a day (QID) | ORAL | Status: DC | PRN

## 2015-10-01 MED ORDER — OXYCODONE-ACETAMINOPHEN 5-325 MG PO TABS: 2.0000 | ORAL_TABLET | ORAL | Status: DC | PRN

## 2015-10-01 MED ORDER — WITCH HAZEL-GLYCERIN EX PADS: 1.0000 "application " | MEDICATED_PAD | CUTANEOUS | Status: DC | PRN

## 2015-10-01 MED ORDER — PRENATAL MULTIVITAMIN CH
1.0000 | ORAL_TABLET | Freq: Every day | ORAL | Status: DC
  Administered 2015-10-02: 1 via ORAL
  Filled 2015-10-01: qty 1

## 2015-10-01 MED ORDER — PROMETHAZINE HCL 25 MG/ML IJ SOLN: 25.0000 mg | Freq: Once | INTRAMUSCULAR | Status: DC

## 2015-10-01 MED ORDER — NALBUPHINE HCL 10 MG/ML IJ SOLN: 5.0000 mg | Freq: Once | INTRAMUSCULAR | Status: DC | PRN

## 2015-10-01 MED ORDER — FAMOTIDINE IN NACL 20-0.9 MG/50ML-% IV SOLN
20.0000 mg | Freq: Once | INTRAVENOUS | Status: AC
  Administered 2015-10-01: 20 mg via INTRAVENOUS
  Filled 2015-10-01: qty 50

## 2015-10-01 MED ORDER — ONDANSETRON HCL 4 MG/2ML IJ SOLN
4.0000 mg | Freq: Once | INTRAMUSCULAR | Status: AC
  Administered 2015-10-01: 4 mg via INTRAMUSCULAR

## 2015-10-01 MED ORDER — DIPHENHYDRAMINE HCL 25 MG PO CAPS
25.0000 mg | ORAL_CAPSULE | Freq: Four times a day (QID) | ORAL | Status: DC | PRN
Start: 2015-10-01 — End: 2015-10-02

## 2015-10-01 MED ORDER — RHO D IMMUNE GLOBULIN 1500 UNIT/2ML IJ SOSY
300.0000 ug | PREFILLED_SYRINGE | Freq: Once | INTRAMUSCULAR | Status: AC
  Administered 2015-10-01: 300 ug via INTRAVENOUS
  Filled 2015-10-01: qty 2

## 2015-10-01 MED ORDER — TETANUS-DIPHTH-ACELL PERTUSSIS 5-2.5-18.5 LF-MCG/0.5 IM SUSP: 0.5000 mL | Freq: Once | INTRAMUSCULAR | Status: DC

## 2015-10-01 MED ORDER — OXYCODONE-ACETAMINOPHEN 5-325 MG PO TABS
1.0000 | ORAL_TABLET | ORAL | Status: DC | PRN
  Administered 2015-10-01 – 2015-10-02 (×3): 1 via ORAL
  Filled 2015-10-01 (×3): qty 1

## 2015-10-01 MED ORDER — SODIUM CHLORIDE 0.9 % IJ SOLN: 3.0000 mL | INTRAMUSCULAR | Status: DC | PRN

## 2015-10-01 MED ORDER — SIMETHICONE 80 MG PO CHEW: 80.0000 mg | CHEWABLE_TABLET | ORAL | Status: DC | PRN

## 2015-10-01 MED ORDER — LANOLIN HYDROUS EX OINT: 1.0000 "application " | TOPICAL_OINTMENT | CUTANEOUS | Status: DC | PRN

## 2015-10-01 MED ORDER — SIMETHICONE 80 MG PO CHEW
80.0000 mg | CHEWABLE_TABLET | ORAL | Status: DC
  Administered 2015-10-01: 80 mg via ORAL
  Filled 2015-10-01: qty 1

## 2015-10-01 MED ORDER — ACETAMINOPHEN 500 MG PO TABS: 1000.0000 mg | ORAL_TABLET | Freq: Four times a day (QID) | ORAL | Status: AC

## 2015-10-01 MED ORDER — OXYTOCIN 40 UNITS IN LACTATED RINGERS INFUSION - SIMPLE MED: 62.5000 mL/h | INTRAVENOUS | Status: AC

## 2015-10-01 MED ORDER — MENTHOL 3 MG MT LOZG: 1.0000 | LOZENGE | OROMUCOSAL | Status: DC | PRN

## 2015-10-01 MED ORDER — ACETAMINOPHEN 325 MG PO TABS: 650.0000 mg | ORAL_TABLET | ORAL | Status: DC | PRN

## 2015-10-01 NOTE — Addendum Note (Signed)
Addendum  created 10/01/15 [id number] by Shanon PayorSuzanne M Teva Bronkema, CRNA   Modules edited: Notes Section   Notes Section:  File: [id number]

## 2015-10-01 NOTE — Lactation Note (Signed)
This note was copied from the chart of Boy Doristine MangoJennifer Wille. Lactation Consultation Note: Lactation Brochure given to mother. Mother states that 5 years ago with her first child she thinks she had a low milk supply.  Mother states that she breast fed for 3 weeks and that her infant lost lots of weight. Lots of discussion on history. She states she never felt her milk come in with her first child. She does remember her areola's becoming darker but no history of infertility or breast changes.  Mother does have a 4 finger span btw her breast and LGT on under side of her breast. Her nipple point downward.  She states she is concerned that she will not make enough milk with this child. She states that infant is latching well and she feels that he is satisfied. Infant is 1616 hours old and has had 3 wet and 3 dirty diapers. Mother has breastfed 5 times. She states that only once did infant latch incorrectly with a painful latch.  Assist with latching infant . Infant latched on with good depth. Observed a few swallows. He sustained latch on and off for 25 mins.  Mother was advised to proceed with post pumping after each feeding to insure good breast stimulation at least every 2-3 hours. Advised mother to feed frequently with all feeding cues. Advised to limit use of the pacifier. Father at the bedside for all teaching. Discussed the use of power pumping later and the use of fenugreek. Mother receptive to teaching plan.   Patient Name: Boy Doristine MangoJennifer Claxton JYNWG'NToday's Date: 10/01/2015 Reason for consult: Follow-up assessment   Maternal Data    Feeding Feeding Type: Breast Fed Length of feed: 25 min (on and off )  LATCH Score/Interventions Latch: Grasps breast easily, tongue down, lips flanged, rhythmical sucking. Intervention(s): Adjust position;Assist with latch;Breast massage;Breast compression  Audible Swallowing: A few with stimulation Intervention(s): Skin to skin;Hand expression  Type of Nipple:  Everted at rest and after stimulation  Comfort (Breast/Nipple): Soft / non-tender     Hold (Positioning): Assistance needed to correctly position infant at breast and maintain latch. Intervention(s): Breastfeeding basics reviewed;Support Pillows;Position options;Skin to skin  LATCH Score: 8  Lactation Tools Discussed/Used     Consult Status Consult Status: Follow-up Date: 10/01/15 Follow-up type: In-patient    Stevan BornKendrick, Markell Sch[facility] 10/01/2015, 5:45 PM

## 2015-10-01 NOTE — Anesthesia Postprocedure Evaluation (Signed)
  Anesthesia Post-op Note  Patient: Veronica RedderJennifer R [name]  Procedure(s) Performed: Procedure(s): CESAREAN SECTION (N/A)  Patient Location: Mother/Baby  Anesthesia Type:Spinal  Level of Consciousness: awake, alert  and oriented  Airway and Oxygen Therapy: Patient Spontanous Breathing  Post-op Pain: none  Post-op Assessment: Post-op Vital signs reviewed, Patient's Cardiovascular Status Stable, Respiratory Function Stable, Patent Airway, No signs of Nausea or vomiting, Adequate PO intake, Pain level controlled, No headache and No backache  bends knees, no residual numbness            Post-op Vital Signs: Reviewed and stable  Last Vitals:  Filed Vitals:   10/01/15 0824  BP: 116/59  Pulse: 66  Temp: 36.3 C  Resp: 17    Complications: No apparent anesthesia complications

## 2015-10-01 NOTE — Progress Notes (Signed)
Subjective: Postpartum Day 1: Cesarean Delivery Patient reports incisional pain.    Objective: Vital signs in last 24 hours: Temp:  [97.4 F (36.3 C)-99.1 F (37.3 C)] 97.4 F (36.3 C) (11/12 0824) Pulse Rate:  [63-102] 66 (11/12 0824) Resp:  [8-29] 17 (11/12 0824) BP: (99-136)/(44-104) 116/59 mmHg (11/12 0824) SpO2:  [89 %-100 %] 99 % (11/12 0824) Weight:  [426 lb (193.232 kg)] 426 lb (193.232 kg) (11/11 2016)  Physical Exam:  General: alert, cooperative and no distress Lochia: appropriate Uterine Fundus: firm Incision: healing well DVT Evaluation: No evidence of DVT seen on physical exam. UO concentrated  Recent Labs  09/30/15 2020 10/01/15 0520  HGB 11.6* 10.2*  HCT 36.1 31.3*    Assessment/Plan: Status post Cesarean section. Doing well postoperatively.  Continue current care. IV fluid bolus  Neyra Pettie II,Keina Mutch E 10/01/2015, 8:46 AM

## 2015-10-01 NOTE — Op Note (Signed)
NAMEMARGIT, [name]            ACCOUNT NO.:  [id number]  MEDICAL RECORD NO.:  [id number]  LOCATION:  9147                          FACILITY:  [facility]  PHYSICIAN:  Guy Sandifer. Henderson Cloud, M.D. DATE OF BIRTH:  Feb 26, 1981  DATE OF PROCEDURE:  09/30/2015 DATE OF DISCHARGE:                              OPERATIVE REPORT   PREOPERATIVE DIAGNOSES: 1. Intrauterine pregnancy, 38-6/7 weeks. 2. Previous cesarean section, desires repeat. 3. Early labor. 4. Desires permanent sterilization.  POSTOPERATIVE DIAGNOSES: 1. Intrauterine pregnancy, 38-6/7 weeks. 2. Previous cesarean section, desires repeat. 3. Early labor. 4. Desires permanent sterilization.  PROCEDURES: 1. Repeat low transverse cesarean section. 2. Bilateral tubal ligation.  SURGEON:  Guy Sandifer. Henderson Cloud, M.D.  ASSISTANTElza Rafter, MD.  ANESTHESIA:  Spinal, Veronica Able, MD.  ESTIMATED BLOOD LOSS:  800 mL.  SPECIMENS:  Bilateral fallopian tube segments to Pathology.  FINDINGS:  Viable female infant.  Apgars, weight, arterial cord pH are pending.  INDICATIONS AND CONSENT:  This patient is a 34 year old married white female, G3, P1 at 38-6/7th weeks, who had a previous cesarean section and desires repeat.  She also desires permanent sterilization.  She presents with the onset of nausea and vomiting with contractions.  She is having frequent uterine contractions.  Repeat cesarean section was discussed with the patient.  Tubal ligations also reviewed.  Potential risks and complications were reviewed preoperatively, including but not limited to infection, organ damage, bleeding requiring transfusion of blood products with HIV and hepatitis acquisition, DVT, PE, pneumonia. Permanence of tubal ligation, alternatives, failure rate, increased ectopic risk were also reviewed.  All questions were answered.  The patient states she agrees, and consent was signed on the chart.  DESCRIPTION OF PROCEDURE:  The patient was taken to the  operating room, where she was identified; and a spinal anesthetic was placed per Dr. Arby Barrette.  She was then placed in a dorsal supine position with a 15- degree left lateral wedge.  The pannus was elevated with a TraXis.  A Foley catheter was placed in the bladder to drain.  She was then prepped and draped per [facility] protocol.  A time-out was undertaken. After testing for adequate spinal anesthesia, skin was entered through the Pfannenstiel scar.  Dissection was carried out in layers of the peritoneum which was entered and extended superiorly and inferiorly. Vesicouterine peritoneum was taken down cephalad-laterally, and the bladder flap was developed.  Bladder blade was placed.  Uterus was incised in a low transverse manner, and the uterine cavity was entered bluntly with a hemostat.  Clear fluid was noted.  The incision was extended digitally.  Vertex was then delivered without difficulty. Loose nuchal cord x1 was reduced.  Baby was delivered.  Cord was clamped and cut; and the baby was handed to awaiting pediatrics team.  Placenta was manually delivered and sent.  Uterine cavity was cleaned.  Uterus was closed in two running locking imbricating layers of 0 Monocryl suture which achieved good hemostasis.  Left fallopian tube was identified from cornu to fimbria and was grasped at the mid-ampullary portion with a Babcock clamp.  Knuckle of tube was doubly ligated with two free ties of plain suture.  The intervening knuckle of  tube was then sharply resected with scissors.  Cautery was used to assure hemostasis. Similar procedure was carried out on the right side.  Ovaries were normal bilaterally.  Anterior peritoneum was closed in a running fashion with 0 Monocryl suture which was also used to reapproximate the pyramidalis muscle in midline.  Anterior rectus fascia was closed in running fashion with a zero looped PDS.  Subcutaneous tissue was closed with interrupted plain, and  the skin was closed with clips.  Pressure dressing was applied.  All counts were correct.  The patient was taken to the recovery room in stable condition.     Guy SandiferJames E. Henderson Cloudomblin, M.D.     JET/MEDQ  D:  09/30/2015  T:  10/01/2015  Job:  [id number]

## 2015-10-02 ENCOUNTER — Encounter (HOSPITAL_COMMUNITY): Payer: Self-pay | Admitting: Obstetrics and Gynecology

## 2015-10-02 LAB — RH IG WORKUP (INCLUDES ABO/RH)
ABO/RH(D): A NEG
FETAL SCREEN: NEGATIVE
Gestational Age(Wks): 38.6
Unit division: 0

## 2015-10-02 LAB — RPR: RPR Ser Ql: NONREACTIVE

## 2015-10-02 MED ORDER — IBUPROFEN 600 MG PO TABS: 600.0000 mg | ORAL_TABLET | Freq: Four times a day (QID) | ORAL | Status: DC | PRN

## 2015-10-02 MED ORDER — OXYCODONE-ACETAMINOPHEN 5-325 MG PO TABS: 1.0000 | ORAL_TABLET | Freq: Four times a day (QID) | ORAL | Status: DC | PRN

## 2015-10-02 NOTE — Discharge Summary (Signed)
Obstetric Discharge Summary Reason for Admission: onset of labor Prenatal Procedures: none Intrapartum Procedures: cesarean: low cervical, transverse Postpartum Procedures: none Complications-Operative and Postpartum: none HEMOGLOBIN  Date Value Ref Range Status  10/01/2015 10.2* 12.0 - 15.0 g/dL Final   HCT  Date Value Ref Range Status  10/01/2015 31.3* 36.0 - 46.0 % Final    Physical Exam:  General: alert, cooperative and no distress Lochia: appropriate Uterine Fundus: firm Incision: healing well DVT Evaluation: No evidence of DVT seen on physical exam.  Discharge Diagnoses: Term Pregnancy-delivered  Discharge Information: Date: 10/02/2015 Activity: pelvic rest Diet: routine Medications: PNV, Ibuprofen and Percocet Condition: stable Instructions: refer to practice specific booklet Discharge to: home Follow-up Information    Follow up In 1 week.      Newborn Data: Live born female  Birth Weight: 9 lb 7.2 oz (4285 g) APGAR: 8, 9  Home with mother.  Nikaela Coyne II,Ivannah Zody E 10/02/2015, 7:52 AM

## 2015-10-02 NOTE — Progress Notes (Signed)
Subjective: Postpartum Day 2: Cesarean Delivery Patient reports tolerating PO, + flatus, + BM and no problems voiding.   Wants to go home Objective: Vital signs in last 24 hours: Temp:  [97.4 F (36.3 C)-98.2 F (36.8 C)] 98.2 F (36.8 C) (11/13 0445) Pulse Rate:  [66-80] 70 (11/13 0445) Resp:  [17-20] 18 (11/13 0445) BP: (109-128)/(48-65) 109/48 mmHg (11/13 0445) SpO2:  [99 %-100 %] 100 % (11/12 2213)  Physical Exam:  General: alert, cooperative and no distress Lochia: appropriate Uterine Fundus: firm Incision: healing well DVT Evaluation: No evidence of DVT seen on physical exam.   Recent Labs  09/30/15 2020 10/01/15 0520  HGB 11.6* 10.2*  HCT 36.1 31.3*    Assessment/Plan: Status post Cesarean section. Doing well postoperatively.  Discharge home with standard precautions and return to clinic in 4-6 weeks.  Adrienne Delay II,Yomara Toothman E 10/02/2015, 7:50 AM

## 2015-10-03 ENCOUNTER — Inpatient Hospital Stay (HOSPITAL_COMMUNITY)
Admission: RE | Admit: 2015-10-03 | Discharge: 2015-10-03 | Disposition: A | Discharge status: Home / Self Care | Payer: BLUE CROSS/BLUE SHIELD | Source: Ambulatory Visit

## 2015-10-04 ENCOUNTER — Inpatient Hospital Stay (HOSPITAL_COMMUNITY)
Admission: AD | Admit: 2015-10-04 | Payer: BLUE CROSS/BLUE SHIELD | Source: Ambulatory Visit | Admitting: Obstetrics and Gynecology

## 2015-10-04 SURGERY — Surgical Case
Anesthesia: Regional | Laterality: Bilateral

## 2016-11-06 ENCOUNTER — Encounter: Payer: Self-pay | Admitting: Physician Assistant

## 2016-11-06 ENCOUNTER — Ambulatory Visit (INDEPENDENT_AMBULATORY_CARE_PROVIDER_SITE_OTHER): Payer: PRIVATE HEALTH INSURANCE | Admitting: Physician Assistant

## 2016-11-06 VITALS — BP 127/86 | HR 97 | Temp 97.5°F | Ht 68.0 in | Wt 379.0 lb

## 2016-11-06 DIAGNOSIS — J209 Acute bronchitis, unspecified: Secondary | ICD-10-CM | POA: Diagnosis not present

## 2016-11-06 MED ORDER — HYDROCODONE-HOMATROPINE 5-1.5 MG/5ML PO SYRP: 5.0000 mL | ORAL_SOLUTION | Freq: Four times a day (QID) | ORAL | 0 refills | Status: DC | PRN

## 2016-11-06 MED ORDER — AZITHROMYCIN 250 MG PO TABS: ORAL_TABLET | ORAL | 0 refills | Status: DC

## 2016-11-06 MED ORDER — METHYLPREDNISOLONE ACETATE 80 MG/ML IJ SUSP
80.0000 mg | Freq: Once | INTRAMUSCULAR | Status: AC
Start: 2016-11-06 — End: 2016-11-06
  Administered 2016-11-06: 80 mg via INTRAMUSCULAR

## 2016-11-06 NOTE — Patient Instructions (Signed)

## 2016-11-06 NOTE — Progress Notes (Signed)
BP 127/86   Pulse 97   Temp 97.5 F (36.4 C) (Oral)   Ht 5\' 8"  (1.727 m)   Wt (!) 379 lb (171.9 kg)   BMI 57.63 kg/m    Subjective:    Patient ID: Veronica RedderJennifer R [name], female    DOB: May 03, 1981, 35 y.o.   MRN: [id number]  Veronica [name] is a 35 y.o. female presenting on 11/06/2016 for Fever; Chills; head congestion; and Cough  HPI Patient here to be established as [facility].  This patient [facility]. She has been sick for over a week now. Her children have also had significant illnesses. She has a history of bronchitis. She has had significant sinus pressure and drainage. She has had cough that is worse at night. She states she has had some production. She did denies any nausea vomiting or diarrhea.  Past Medical History:  Diagnosis Date  . Asthma   . Former smoker   . GERD (gastroesophageal reflux disease)   . History of seizures as a child    FEBRILE--  NONE SINCE  . Missed ab 10/2014   D&E  . Seizures (HCC)    febrile as child  . Vaginal Pap smear, abnormal    ok since LEEP  . Wears glasses    Relevant past medical, surgical, family and social history reviewed and updated as indicated. Interim medical history since our last visit reviewed. Allergies and medications reviewed and updated.   Data reviewed from any sources in EPIC.  Review of Systems  Constitutional: Positive for chills and fatigue. Negative for activity change and appetite change.  HENT: Positive for congestion, postnasal drip and sore throat.   Eyes: Negative.   Respiratory: Positive for cough and wheezing.   Cardiovascular: Negative.  Negative for chest pain, palpitations and leg swelling.  Gastrointestinal: Negative.   Genitourinary: Negative.   Musculoskeletal: Negative.   Skin: Negative.   Neurological: Positive for headaches.     Social History   Social History  . Marital status: Married    Spouse name: N/A  .  Number of children: N/A  . Years of education: N/A   Occupational History  . Not on file.   Social History Main Topics  . Smoking status: Former Games developermoker  . Smokeless tobacco: Never Used     Comment: occ smoker as young adult  . Alcohol use No  . Drug use: No  . Sexual activity: Not on file   Other Topics Concern  . Not on file   Social History Narrative  . No narrative on file    Past Surgical History:  Procedure Laterality Date  . CESAREAN SECTION  12-05-2009  . CESAREAN SECTION N/A 09/30/2015   Procedure: CESAREAN SECTION;  Surgeon: Harold HedgeJames Tomblin, MD;  Location: [facility];  Service: Obstetrics;  Laterality: N/A;  . DILATION AND EVACUATION N/A 11/17/2014   Procedure: DILATATION AND EVACUATION;  Surgeon: Juluis MireJohn S McComb, MD;  Location: [facility]S;  Service: Gynecology;  Laterality: N/A;  . LEEP    . WISDOM TOOTH EXTRACTION      Family History  Problem Relation Age of Onset  . Hypertension Mother   . Cancer Mother     breast  . Hyperlipidemia Father   . Hearing loss Father   . Cancer Maternal Grandmother     ?lung  . Diabetes Maternal Grandfather   . Cancer Paternal Grandmother     lung  Allergies as of 11/06/2016      Reactions   Penicillins Swelling      Medication List       Accurate as of 11/06/16  8:52 AM. Always use your most recent med list.          albuterol 108 (90 Base) MCG/ACT inhaler Commonly known as:  PROVENTIL HFA;VENTOLIN HFA Inhale 2 puffs into the lungs every 6 (six) hours as needed for wheezing or shortness of breath.   azithromycin 250 MG tablet Commonly known as:  ZITHROMAX Z-PAK As directed   HYDROcodone-homatropine 5-1.5 MG/5ML syrup Commonly known as:  HYCODAN Take 5-10 mLs by mouth every 6 (six) hours as needed.          Objective:    BP 127/86   Pulse 97   Temp 97.5 F (36.4 C) (Oral)   Ht 5\' 8"  (1.727 m)   Wt (!) 379 lb (171.9 kg)   BMI 57.63 kg/m   Allergies  Allergen Reactions  . Penicillins Swelling   Wt  Readings from Last 3 Encounters:  11/06/16 (!) 379 lb (171.9 kg)  09/30/15 (!) 426 lb (193.2 kg)  11/16/14 (!) 371 lb (168.3 kg)    Physical Exam  Constitutional: She is oriented to person, place, and time. She appears well-developed and well-nourished.  HENT:  Head: Normocephalic and atraumatic.  Right Ear: There is drainage and tenderness.  Left Ear: There is drainage and tenderness.  Nose: Mucosal edema and rhinorrhea present. Right sinus exhibits maxillary sinus tenderness and frontal sinus tenderness. Left sinus exhibits maxillary sinus tenderness and frontal sinus tenderness.  Mouth/Throat: Oropharyngeal exudate and posterior oropharyngeal erythema present.  Eyes: Conjunctivae and EOM are normal. Pupils are equal, round, and reactive to light.  Neck: Normal range of motion. Neck supple.  Cardiovascular: Normal rate, regular rhythm, normal heart sounds and intact distal pulses.   Pulmonary/Chest: Effort normal. She has wheezes in the right upper field and the left upper field.  Abdominal: Soft. Bowel sounds are normal.  Neurological: She is alert and oriented to person, place, and time. She has normal reflexes.  Skin: Skin is warm and dry. No rash noted.  Psychiatric: She has a normal mood and affect. Her behavior is normal. Judgment and thought content normal.        Assessment & Plan:   1. Acute bronchitis, unspecified organism - azithromycin (ZITHROMAX Z-PAK) 250 MG tablet; As directed  Dispense: 6 tablet; Refill: 0 - methylPREDNISolone acetate (DEPO-MEDROL) injection 80 mg; Inject 1 mL (80 mg total) into the muscle once. - HYDROcodone-homatropine (HYCODAN) 5-1.5 MG/5ML syrup; Take 5-10 mLs by mouth every 6 (six) hours as needed.  Dispense: 240 mL; Refill: 0   Continue all other maintenance medications as listed above. Educational handout given for Bronchitis  Follow up plan: Return if symptoms worsen or fail to improve. Out of work 11/06/2016  Remus LofflerAngel S. Takeru Bose  PA-C [facility] [address]  [place], [place] [id number] [phone]   11/06/2016, 8:52 AM

## 2016-11-09 ENCOUNTER — Telehealth: Payer: Self-pay | Admitting: Physician Assistant

## 2016-11-09 MED ORDER — OSELTAMIVIR PHOSPHATE 75 MG PO CAPS: 75.0000 mg | ORAL_CAPSULE | Freq: Two times a day (BID) | ORAL | 0 refills | Status: DC

## 2016-11-09 NOTE — Telephone Encounter (Signed)
Very good documentation by RN. I think it is reasonable given the holiday.   Murtis SinkSam Bradshaw, MD [facility] 11/09/2016, 12:19 PM

## 2016-11-09 NOTE — Telephone Encounter (Signed)
Patient aware. Will f/u if needed.

## 2016-11-09 NOTE — Telephone Encounter (Signed)
Patient was diagnosed with bronchitis several days ago and is taking her antibiotic, mucinex, and using her inhaler. She is feeling better but continues to have a cough and some nasal congestion. Her children were diagnosed with the flu yesterday. Her main concern is that if she didn't have the flu that she how now been exposed and could develop it over the next few days. I discussed her symptoms and she was running a fever up to 102 prior to coming in for eval.  Explained that due to potential past cost of $175 we don't run a flu test on everyone. She was ok with that. She states that she expected to feel worse with the flu.   It is possible that she had the flu along with bronchitis. She should continue her current medication regimen. May want to add pseudoephedrine 30mg  1-2 every 4-6 hours for nasal symptom relief. Suggested Flonase but she said that it causes nose bleeds.   Explained that Tamiflu is most effective when taken within 48 hours of symptom onset. At this point, if she has the flu it won't be effective. She would like to have a prescription on hold at [facility] in case she didn't have the flu and she develops it while our office is closed for Christmas. She didn't necessarily want to take it prophylactically but wants it on hand in case she develops symptoms.   Since Lawanna Kobusngel is out, does this sound reasonable to you? If so, can you send in a script to [facility] and add a pharmacy note to "hold" it and she will call if she wants to pick it up.

## 2016-11-14 ENCOUNTER — Telehealth: Payer: PRIVATE HEALTH INSURANCE | Admitting: Family

## 2016-11-14 DIAGNOSIS — N3001 Acute cystitis with hematuria: Secondary | ICD-10-CM

## 2016-11-14 MED ORDER — NITROFURANTOIN MONOHYD MACRO 100 MG PO CAPS
100.0000 mg | ORAL_CAPSULE | Freq: Two times a day (BID) | ORAL | 0 refills | Status: DC
Start: 2016-11-14 — End: 2017-04-26

## 2016-11-14 NOTE — Progress Notes (Signed)

## 2017-04-22 ENCOUNTER — Telehealth: Payer: PRIVATE HEALTH INSURANCE | Admitting: Family

## 2017-04-22 DIAGNOSIS — B9689 Other specified bacterial agents as the cause of diseases classified elsewhere: Secondary | ICD-10-CM

## 2017-04-22 DIAGNOSIS — J028 Acute pharyngitis due to other specified organisms: Secondary | ICD-10-CM

## 2017-04-22 DIAGNOSIS — J209 Acute bronchitis, unspecified: Secondary | ICD-10-CM

## 2017-04-22 MED ORDER — AZITHROMYCIN 250 MG PO TABS: ORAL_TABLET | ORAL | 0 refills | Status: DC

## 2017-04-22 MED ORDER — BENZONATATE 100 MG PO CAPS: 100.0000 mg | ORAL_CAPSULE | Freq: Three times a day (TID) | ORAL | 0 refills | Status: DC | PRN

## 2017-04-22 NOTE — Progress Notes (Signed)
Thank you for the details you put in the comment boxes. Those details really help us take better care of you. Thank you for the details in our phone call also.   We are sorry that you are not feeling well.  Here is how we plan to help!  Based on what you have shared with me it looks like you have upper respiratory tract inflammation that has resulted in a significant cough.  Inflammation and infection in the upper respiratory tract is commonly called bronchitis and has four common causes:  Allergies, Viral Infections, Acid Reflux and Bacterial Infections.  Allergies, viruses and acid reflux are treated by controlling symptoms or eliminating the cause. An example might be a cough caused by taking certain blood pressure medications. You stop the cough by changing the medication. Another example might be a cough caused by acid reflux. Controlling the reflux helps control the cough.  Based on your presentation I believe you most likely have A cough due to bacteria.  When patients have a fever and a productive cough with a change in color or increased sputum production, we are concerned about bacterial bronchitis.  If left untreated it can progress to pneumonia.  If your symptoms do not improve with your treatment plan it is important that you contact your provider.   I have prescribed Azithromyin 250 mg: two tables now and then one tablet daily for 4 additonal days   In addition you may use A non-prescription cough medication called Mucinex DM: take 2 tablets every 12 hours. and A prescription cough medication called Tessalon Perles 100mg . You may take 1-2 capsules every 8 hours as needed for your cough.  USE OF BRONCHODILATOR ("RESCUE") INHALERS: There is a risk from using your bronchodilator too frequently.  The risk is that over-reliance on a medication which only relaxes the muscles surrounding the breathing tubes can reduce the effectiveness of medications prescribed to reduce swelling and congestion of  the tubes themselves.  Although you feel brief relief from the bronchodilator inhaler, your asthma may actually be worsening with the tubes becoming more swollen and filled with mucus.  This can delay other crucial treatments, such as oral steroid medications. If you need to use a bronchodilator inhaler daily, several times per day, you should discuss this with your provider.  There are probably better treatments that could be used to keep your asthma under control.     HOME CARE . Only take medications as instructed by your medical team. . Complete the entire course of an antibiotic. . Drink plenty of fluids and get plenty of rest. . Avoid close contacts especially the very young and the elderly . Cover your mouth if you cough or cough into your sleeve. . Always remember to wash your hands . A steam or ultrasonic humidifier can help congestion.   GET HELP RIGHT AWAY IF: . You develop worsening fever. . You become short of breath . You cough up blood. . Your symptoms persist after you have completed your treatment plan MAKE SURE YOU   Understand these instructions.  Will watch your condition.  Will get help right away if you are not doing well or get worse.  Your e-visit answers were reviewed by a board certified advanced clinical practitioner to complete your personal care plan.  Depending on the condition, your plan could have included both over the counter or prescription medications. If there is a problem please reply  once you have received a response from your provider. Your  safety is important to [place].  If you have drug allergies check your prescription carefully.    You can use MyChart to ask questions about today's visit, request a non-urgent call back, or ask for a work or school excuse for 24 hours related to this e-Visit. If it has been greater than 24 hours you will need to follow up with your provider, or enter a new e-Visit to address those concerns. You will get an e-mail  in the next two days asking about your experience.  I hope that your e-visit has been valuable and will speed your recovery. Thank you for using e-visits.

## 2017-04-25 ENCOUNTER — Encounter: Payer: Self-pay | Admitting: Physician Assistant

## 2017-04-26 ENCOUNTER — Ambulatory Visit (INDEPENDENT_AMBULATORY_CARE_PROVIDER_SITE_OTHER): Payer: PRIVATE HEALTH INSURANCE | Admitting: Pediatrics

## 2017-04-26 ENCOUNTER — Encounter: Payer: Self-pay | Admitting: Pediatrics

## 2017-04-26 VITALS — BP 132/87 | HR 81 | Temp 97.4°F | Resp 22 | Ht 68.0 in | Wt 380.8 lb

## 2017-04-26 DIAGNOSIS — J4521 Mild intermittent asthma with (acute) exacerbation: Secondary | ICD-10-CM | POA: Diagnosis not present

## 2017-04-26 DIAGNOSIS — J069 Acute upper respiratory infection, unspecified: Secondary | ICD-10-CM | POA: Diagnosis not present

## 2017-04-26 MED ORDER — PREDNISONE 20 MG PO TABS: ORAL_TABLET | ORAL | 0 refills | Status: DC

## 2017-04-26 MED ORDER — ALBUTEROL SULFATE HFA 108 (90 BASE) MCG/ACT IN AERS: 2.0000 | INHALATION_SPRAY | Freq: Four times a day (QID) | RESPIRATORY_TRACT | 1 refills | Status: DC | PRN

## 2017-04-26 MED ORDER — SPACER/AERO CHAMBER MOUTHPIECE MISC
1.0000 | Freq: Four times a day (QID) | 0 refills | Status: DC | PRN
Start: 2017-04-26 — End: 2018-09-02

## 2017-04-26 NOTE — Patient Instructions (Signed)
Inhaler 3 times a day with spacer  Prednisone 40mg  for three days

## 2017-04-26 NOTE — Progress Notes (Signed)
  Subjective:   Patient ID: Veronica [name], female    DOB: May 12, 1981, 36 y.o.   MRN: [id number] CC: Wheezing and Cough  HPI: Veronica [name] is a 36 y.o. female presenting for Wheezing and Cough  4 days ago start azithromycin for wheezing  Started getting sick 8 days ago, had congestion, coughing No sore throat, no ear pain Multiple family members sick as well Temperature to 101 when she first started getting sick, none now Wheezing bothering her the most, feeling SOB getting the the car Doesn't think shes had steroids within the last year Usually breathing is fine, does get wheezy with URIs  Relevant past medical, surgical, family and social history reviewed. Allergies and medications reviewed and updated. History  Smoking Status  . Former Smoker  Smokeless Tobacco  . Never Used    Comment: occ smoker as young adult   ROS: Per HPI   Objective:    BP 132/87   Pulse 81   Temp 97.4 F (36.3 C) (Oral)   Resp (!) 22   Ht 5\' 8"  (1.727 m)   Wt (!) 380 lb 12.8 oz (172.7 kg)   SpO2 95%   BMI 57.90 kg/m   Wt Readings from Last 3 Encounters:  04/26/17 (!) 380 lb 12.8 oz (172.7 kg)  11/06/16 (!) 379 lb (171.9 kg)  09/30/15 (!) 426 lb (193.2 kg)    Gen: NAD, alert, cooperative with exam, NCAT EYES: EOMI, no conjunctival injection, or no icterus ENT:  TMs pink with nl LR b/l, OP without erythema LYMPH: no cervical LAD CV: NRRR, normal S1/S2, no murmur, distal pulses 2+ b/l Resp: coarse breath sounds, wheezing with forced exhalation, no crackles, normal WOB Ext: No edema, warm Neuro: Alert and oriented  Assessment & Plan:  Veronica [name] was seen today for wheezing and cough.  Diagnoses and all orders for this visit:  Mild intermittent asthma with exacerbation Albuterol TID for next few dasys until wheezing improves Prednisone Today last day of azithromycin Discussed return precautions -     predniSONE (DELTASONE) 20 MG tablet; 2 po at same time daily for 3 days -      albuterol (PROVENTIL HFA;VENTOLIN HFA) 108 (90 Base) MCG/ACT inhaler; Inhale 2 puffs into the lungs every 6 (six) hours as needed for wheezing or shortness of breath. -     Spacer/Aero Chamber Mouthpiece MISC; 1 each by Does not apply route every 6 (six) hours as needed.  Acute URI Symptom care discussed  Follow up plan: As needed Rex Krasarol Vincent, MD [facility]

## 2017-05-14 ENCOUNTER — Telehealth: Payer: PRIVATE HEALTH INSURANCE | Admitting: Family

## 2017-05-14 ENCOUNTER — Encounter: Payer: Self-pay | Admitting: Family

## 2017-05-14 DIAGNOSIS — M542 Cervicalgia: Secondary | ICD-10-CM

## 2017-05-14 MED ORDER — CYCLOBENZAPRINE HCL 10 MG PO TABS
10.0000 mg | ORAL_TABLET | Freq: Three times a day (TID) | ORAL | 0 refills | Status: DC | PRN
Start: 2017-05-14 — End: 2017-09-24

## 2017-05-14 MED ORDER — NAPROXEN 500 MG PO TABS: 500.0000 mg | ORAL_TABLET | Freq: Two times a day (BID) | ORAL | 0 refills | Status: DC

## 2017-05-14 NOTE — Progress Notes (Signed)

## 2017-05-22 ENCOUNTER — Telehealth: Payer: PRIVATE HEALTH INSURANCE | Admitting: Physician Assistant

## 2017-05-22 DIAGNOSIS — R399 Unspecified symptoms and signs involving the genitourinary system: Secondary | ICD-10-CM

## 2017-05-22 MED ORDER — NITROFURANTOIN MONOHYD MACRO 100 MG PO CAPS: 100.0000 mg | ORAL_CAPSULE | Freq: Two times a day (BID) | ORAL | 0 refills | Status: DC

## 2017-05-22 NOTE — Progress Notes (Signed)

## 2017-08-20 ENCOUNTER — Telehealth: Payer: PRIVATE HEALTH INSURANCE | Admitting: Nurse Practitioner

## 2017-08-20 DIAGNOSIS — B372 Candidiasis of skin and nail: Secondary | ICD-10-CM

## 2017-08-20 MED ORDER — NYSTATIN 100000 UNIT/GM EX CREA: 1.0000 "application " | TOPICAL_CREAM | Freq: Two times a day (BID) | CUTANEOUS | 0 refills | Status: DC

## 2017-08-20 NOTE — Progress Notes (Signed)
E Visit for Rash  We are sorry that you are not feeling well. Here is how we plan to help!    Based upon your presentation it appears you have a fungal infection.  I have prescribed: and Nystatin cream apply to the affected area twice daily   HOME CARE:   Take cool showers and avoid direct sunlight.  Apply cool compress or wet dressings.  Take a bath in an oatmeal bath.  Sprinkle content of one Aveeno packet under running faucet with comfortably warm water.  Bathe for 15-20 minutes, 1-2 times daily.  Pat dry with a towel. Do not rub the rash.  Use hydrocortisone cream.  Take an antihistamine like Benadryl for widespread rashes that itch.  The adult dose of Benadryl is 25-50 mg by mouth 4 times daily.  Caution:  This type of medication may cause sleepiness.  Do not drink alcohol, drive, or operate dangerous machinery while taking antihistamines.  Do not take these medications if you have prostate enlargement.  Read package instructions thoroughly on all medications that you take.  GET HELP RIGHT AWAY IF:   Symptoms don't go away after treatment.  Severe itching that persists.  If you rash spreads or swells.  If you rash begins to smell.  If it blisters and opens or develops a yellow-brown crust.  You develop a fever.  You have a sore throat.  You become short of breath.  MAKE SURE YOU:  Understand these instructions. Will watch your condition. Will get help right away if you are not doing well or get worse.  Thank you for choosing an e-visit. Your e-visit answers were reviewed by a board certified advanced clinical practitioner to complete your personal care plan. Depending upon the condition, your plan could have included both over the counter or prescription medications. Please review your pharmacy choice. Be sure that the pharmacy you have chosen is open so that you can pick up your prescription now.  If there is a problem you may message your provider in MyChart  to have the prescription routed to another pharmacy. Your safety is important to us. If you have drug allergies check your prescription carefully.  For the next 24 hours, you can use MyChart to ask questions about today's visit, request a non-urgent call back, or ask for a work or school excuse from your e-visit provider. You will get an email in the next two days asking about your experience. I hope that your e-visit has been valuable and will speed your recovery.      

## 2017-09-24 ENCOUNTER — Encounter: Payer: Self-pay | Admitting: Physician Assistant

## 2017-09-24 ENCOUNTER — Ambulatory Visit (INDEPENDENT_AMBULATORY_CARE_PROVIDER_SITE_OTHER): Payer: PRIVATE HEALTH INSURANCE | Admitting: Physician Assistant

## 2017-09-24 VITALS — BP 117/77 | HR 84 | Temp 98.6°F | Ht 68.0 in | Wt 373.4 lb

## 2017-09-24 DIAGNOSIS — H6690 Otitis media, unspecified, unspecified ear: Secondary | ICD-10-CM | POA: Insufficient documentation

## 2017-09-24 DIAGNOSIS — K219 Gastro-esophageal reflux disease without esophagitis: Secondary | ICD-10-CM | POA: Diagnosis not present

## 2017-09-24 MED ORDER — AZITHROMYCIN 250 MG PO TABS: ORAL_TABLET | ORAL | 1 refills | Status: DC

## 2017-09-24 MED ORDER — OMEPRAZOLE 20 MG PO CPDR: 20.0000 mg | DELAYED_RELEASE_CAPSULE | Freq: Every day | ORAL | 5 refills | Status: DC

## 2017-09-24 NOTE — Progress Notes (Signed)
BP 117/77   Pulse 84   Temp 98.6 F (37 C) (Oral)   Ht 5\' 8"  (1.727 m)   Wt (!) 373 lb 6.4 oz (169.4 kg)   BMI 56.78 kg/m    Subjective:    Patient ID: Veronica [name], female    DOB: Jul 24, 1981, 36 y.o.   MRN: [id number]  HPI: Veronica [name] is a 36 y.o. female presenting on 09/24/2017 for Ear Pain (bilateral ) and Gastroesophageal Reflux  She has had greater than 2 weeks of an upper respiratory infection.  Now she is having a significant cough and pressure in her ears.  She denies any fever or chills.  She also has had a recurrence of her GERD symptoms.  She had it many years ago.  Sometimes when she eats at night and does a lot of movement in the house and bending over she will have reflux come up and vomit.  She denies any fever chills with that.  She is currently not taking any acid reducing medicine.  Relevant past medical, surgical, family and social history reviewed and updated as indicated. Allergies and medications reviewed and updated.  Past Medical History:  Diagnosis Date  . Asthma   . Former smoker   . GERD (gastroesophageal reflux disease)   . History of seizures as a child    FEBRILE--  NONE SINCE  . Incomplete spontaneous abortion 11/17/2014  . Missed ab 10/2014   D&E  . Seizures (HCC)    febrile as child  . Vaginal Pap smear, abnormal    ok since LEEP  . Wears glasses     Past Surgical History:  Procedure Laterality Date  . CESAREAN SECTION  12-05-2009  . LEEP    . WISDOM TOOTH EXTRACTION      Review of Systems  Constitutional: Positive for chills and fatigue. Negative for activity change and appetite change.  HENT: Positive for congestion, postnasal drip and sore throat.   Eyes: Negative.   Respiratory: Positive for cough and wheezing.   Cardiovascular: Negative.  Negative for chest pain, palpitations and leg swelling.  Gastrointestinal: Positive for abdominal pain. Negative for abdominal distention.  Genitourinary: Negative.     Musculoskeletal: Negative.   Skin: Negative.   Neurological: Positive for headaches.    Allergies as of 09/24/2017      Reactions   Penicillins Swelling      Medication List        Accurate as of 09/24/17  4:01 PM. Always use your most recent med list.          albuterol 108 (90 Base) MCG/ACT inhaler Commonly known as:  PROVENTIL HFA;VENTOLIN HFA Inhale 2 puffs into the lungs every 6 (six) hours as needed for wheezing or shortness of breath.   azithromycin 250 MG tablet Commonly known as:  ZITHROMAX Take as directed   omeprazole 20 MG capsule Commonly known as:  PRILOSEC Take 1 capsule (20 mg total) daily by mouth.   Spacer/Aero Chamber Mouthpiece Misc 1 each by Does not apply route every 6 (six) hours as needed.          Objective:    BP 117/77   Pulse 84   Temp 98.6 F (37 C) (Oral)   Ht 5\' 8"  (1.727 m)   Wt (!) 373 lb 6.4 oz (169.4 kg)   BMI 56.78 kg/m   Allergies  Allergen Reactions  . Penicillins Swelling    Physical Exam  Constitutional: She is oriented to person, place, and  time. She appears well-developed and well-nourished.  HENT:  Head: Normocephalic and atraumatic.  Right Ear: A middle ear effusion is present.  Left Ear: A middle ear effusion is present.  Nose: Mucosal edema present. Right sinus exhibits no frontal sinus tenderness. Left sinus exhibits no frontal sinus tenderness.  Mouth/Throat: Posterior oropharyngeal erythema present. No oropharyngeal exudate or tonsillar abscesses.  Eyes: Conjunctivae and EOM are normal. Pupils are equal, round, and reactive to light.  Neck: Normal range of motion.  Cardiovascular: Normal rate, regular rhythm, normal heart sounds and intact distal pulses.  Pulmonary/Chest: Effort normal and breath sounds normal.  Abdominal: Soft. Bowel sounds are normal.  Neurological: She is alert and oriented to person, place, and time. She has normal reflexes.  Skin: Skin is warm and dry. No rash noted.  Psychiatric:  She has a normal mood and affect. Her behavior is normal. Judgment and thought content normal.  Nursing note and vitals reviewed.   Results for orders placed or performed during the hospital encounter of 10/03/15  OB RESULTS CONSOLE GC/Chlamydia  Result Value Ref Range   Gonorrhea Negative    Chlamydia Negative   OB RESULTS CONSOLE RPR  Result Value Ref Range   RPR Nonreactive   OB RESULTS CONSOLE HIV antibody  Result Value Ref Range   HIV Non-reactive   OB RESULTS CONSOLE Hepatitis B surface antigen  Result Value Ref Range   Hepatitis B Surface Ag Negative   OB RESULTS CONSOLE GC/Chlamydia  Result Value Ref Range   Gonorrhea Negative    Chlamydia Negative   OB RESULTS CONSOLE RPR  Result Value Ref Range   RPR Nonreactive   OB RESULTS CONSOLE HIV antibody  Result Value Ref Range   HIV Non-reactive   OB RESULTS CONSOLE Rubella Antibody  Result Value Ref Range   Rubella Immune   OB RESULTS CONSOLE ABO/Rh  Result Value Ref Range   RH Type  Negative    ABO Grouping A   OB RESULTS CONSOLE Antibody Screen  Result Value Ref Range   Antibody Screen Negative       Assessment & Plan:   1. Gastroesophageal reflux disease, esophagitis presence not specified - omeprazole (PRILOSEC) 20 MG capsule; Take 1 capsule (20 mg total) daily by mouth.  Dispense: 30 capsule; Refill: 5  2. Acute otitis media, unspecified otitis media type - azithromycin (ZITHROMAX) 250 MG tablet; Take as directed  Dispense: 6 tablet; Refill: 1    Current Outpatient Medications:  .  albuterol (PROVENTIL HFA;VENTOLIN HFA) 108 (90 Base) MCG/ACT inhaler, Inhale 2 puffs into the lungs every 6 (six) hours as needed for wheezing or shortness of breath., Disp: 1 Inhaler, Rfl: 1 .  Spacer/Aero Chamber Mouthpiece MISC, 1 each by Does not apply route every 6 (six) hours as needed., Disp: 1 each, Rfl: 0 .  azithromycin (ZITHROMAX) 250 MG tablet, Take as directed, Disp: 6 tablet, Rfl: 1 .  omeprazole (PRILOSEC) 20 MG  capsule, Take 1 capsule (20 mg total) daily by mouth., Disp: 30 capsule, Rfl: 5 Continue all other maintenance medications as listed above.  Follow up plan: Return if symptoms worsen or fail to improve.  Educational handout given for survey  Remus LofflerAngel S. Sinan Tuch PA-C [facility] [address]  [place], [place] [id number] [phone]   09/24/2017, 4:01 PM

## 2017-09-24 NOTE — Patient Instructions (Signed)
In a few days you may receive a survey in the mail or online from [employer] regarding your visit with us today. Please take a moment to fill this out. Your feedback is very important to our whole office. It can help us better understand your needs as well as improve your experience and satisfaction. Thank you for taking your time to complete it. We care about you.  Nataliyah Packham, PA-C  

## 2017-12-04 ENCOUNTER — Telehealth: Payer: Self-pay | Admitting: Nurse Practitioner

## 2017-12-04 DIAGNOSIS — J0100 Acute maxillary sinusitis, unspecified: Secondary | ICD-10-CM

## 2017-12-04 MED ORDER — DOXYCYCLINE HYCLATE 100 MG PO TABS: 100.0000 mg | ORAL_TABLET | Freq: Two times a day (BID) | ORAL | 0 refills | Status: DC

## 2017-12-04 NOTE — Progress Notes (Signed)

## 2018-01-18 ENCOUNTER — Telehealth: Payer: Self-pay | Admitting: Nurse Practitioner

## 2018-01-18 DIAGNOSIS — N3000 Acute cystitis without hematuria: Secondary | ICD-10-CM

## 2018-01-18 MED ORDER — NITROFURANTOIN MONOHYD MACRO 100 MG PO CAPS: 100.0000 mg | ORAL_CAPSULE | Freq: Two times a day (BID) | ORAL | 0 refills | Status: DC

## 2018-01-18 NOTE — Progress Notes (Signed)

## 2018-02-17 ENCOUNTER — Telehealth: Payer: Self-pay | Admitting: Family

## 2018-02-17 DIAGNOSIS — R319 Hematuria, unspecified: Secondary | ICD-10-CM

## 2018-02-17 DIAGNOSIS — N390 Urinary tract infection, site not specified: Secondary | ICD-10-CM

## 2018-02-17 MED ORDER — CIPROFLOXACIN HCL 500 MG PO TABS: 500.0000 mg | ORAL_TABLET | Freq: Two times a day (BID) | ORAL | 0 refills | Status: DC

## 2018-02-17 NOTE — Progress Notes (Signed)
Thank you for the details you included in the comment boxes. Those details are very helpful in determining the best course of treatment for you and help us to provide the best care.I sent a different antibiotic this time.   We are sorry that you are not feeling well.  Here is how we plan to help!  Based on what you shared with me it looks like you most likely have a simple urinary tract infection.  A UTI (Urinary Tract Infection) is a bacterial infection of the bladder.  Most cases of urinary tract infections are simple to treat but a key part of your care is to encourage you to drink plenty of fluids and watch your symptoms carefully.  I have prescribed Ciprofloxacin 500 mg twice a day for 5 days.  Your symptoms should gradually improve. Call us if the burning in your urine worsens, you develop worsening fever, back pain or pelvic pain or if your symptoms do not resolve after completing the antibiotic.  Urinary tract infections can be prevented by drinking plenty of water to keep your body hydrated.  Also be sure when you wipe, wipe from front to back and don't hold it in!  If possible, empty your bladder every 4 hours.  Your e-visit answers were reviewed by a board certified advanced clinical practitioner to complete your personal care plan.  Depending on the condition, your plan could have included both over the counter or prescription medications.  If there is a problem please reply  once you have received a response from your provider.  Your safety is important to us.  If you have drug allergies check your prescription carefully.    You can use MyChart to ask questions about today's visit, request a non-urgent call back, or ask for a work or school excuse for 24 hours related to this e-Visit. If it has been greater than 24 hours you will need to follow up with your provider, or enter a new e-Visit to address those concerns.   You will get an e-mail in the next two days asking about your  experience.  I hope that your e-visit has been valuable and will speed your recovery. Thank you for using e-visits.

## 2018-02-24 ENCOUNTER — Encounter: Payer: Self-pay | Admitting: Physician Assistant

## 2018-02-24 ENCOUNTER — Ambulatory Visit: Payer: PRIVATE HEALTH INSURANCE | Admitting: Physician Assistant

## 2018-02-24 VITALS — BP 117/72 | HR 81 | Temp 98.7°F | Ht 68.0 in | Wt 384.6 lb

## 2018-02-24 DIAGNOSIS — J9801 Acute bronchospasm: Secondary | ICD-10-CM

## 2018-02-24 DIAGNOSIS — J40 Bronchitis, not specified as acute or chronic: Secondary | ICD-10-CM | POA: Diagnosis not present

## 2018-02-24 MED ORDER — FLUCONAZOLE 150 MG PO TABS: ORAL_TABLET | ORAL | 0 refills | Status: DC

## 2018-02-24 MED ORDER — DOXYCYCLINE HYCLATE 100 MG PO TABS: 100.0000 mg | ORAL_TABLET | Freq: Two times a day (BID) | ORAL | 0 refills | Status: DC

## 2018-02-24 MED ORDER — FLUTICASONE FUROATE-VILANTEROL 200-25 MCG/INH IN AEPB: 1.0000 | INHALATION_SPRAY | Freq: Every day | RESPIRATORY_TRACT | 0 refills | Status: DC

## 2018-02-24 NOTE — Progress Notes (Signed)
BP 117/72   Pulse 81   Temp 98.7 F (37.1 C) (Oral)   Ht 5\' 8"  (1.727 m)   Wt (!) 384 lb 9.6 oz (174.5 kg)   SpO2 99%   BMI 58.48 kg/m    Subjective:    Patient ID: Veronica [name], female    DOB: July 16, 1981, 37 y.o.   MRN: [id number]  HPI: Veronica [name] is a 37 y.o. female presenting on 02/24/2018 for Cough and Wheezing  Patient with several days of progressing upper respiratory and bronchial symptoms. Initially there was more upper respiratory congestion. This progressed to having significant cough that is productive throughout the day and severe at night. There is occasional wheezing after coughing. Sometimes there is slight dyspnea on exertion. It is productive mucus that is yellow in color. Denies any blood.   Past Medical History:  Diagnosis Date  . Asthma   . Former smoker   . GERD (gastroesophageal reflux disease)   . History of seizures as a child    FEBRILE--  NONE SINCE  . Incomplete spontaneous abortion 11/17/2014  . Missed ab 10/2014   D&E  . Seizures (HCC)    febrile as child  . Vaginal Pap smear, abnormal    ok since LEEP  . Wears glasses    Relevant past medical, surgical, family and social history reviewed and updated as indicated. Interim medical history since our last visit reviewed. Allergies and medications reviewed and updated. DATA REVIEWED: CHART IN EPIC  Family History reviewed for pertinent findings.  Review of Systems  Constitutional: Positive for fatigue. Negative for activity change, appetite change, chills and fever.  HENT: Positive for congestion and postnasal drip. Negative for sore throat.   Eyes: Negative.   Respiratory: Positive for cough, shortness of breath and wheezing.   Cardiovascular: Negative.  Negative for chest pain, palpitations and leg swelling.  Gastrointestinal: Negative.   Genitourinary: Negative.   Musculoskeletal: Negative.   Skin: Negative.   Neurological: Negative for headaches.    Allergies as of  02/24/2018      Reactions   Penicillins Swelling      Medication List        Accurate as of 02/24/18 10:47 AM. Always use your most recent med list.          albuterol 108 (90 Base) MCG/ACT inhaler Commonly known as:  PROVENTIL HFA;VENTOLIN HFA Inhale 2 puffs into the lungs every 6 (six) hours as needed for wheezing or shortness of breath.   doxycycline 100 MG tablet Commonly known as:  VIBRA-TABS Take 1 tablet (100 mg total) by mouth 2 (two) times daily. 1 po bid   fluconazole 150 MG tablet Commonly known as:  DIFLUCAN 1 po q week x 4 weeks   fluticasone furoate-vilanterol 200-25 MCG/INH Aepb Commonly known as:  BREO ELLIPTA Inhale 1 puff into the lungs daily.   omeprazole 20 MG capsule Commonly known as:  PRILOSEC Take 1 capsule (20 mg total) daily by mouth.   Spacer/Aero Chamber Mouthpiece Misc 1 each by Does not apply route every 6 (six) hours as needed.          Objective:    BP 117/72   Pulse 81   Temp 98.7 F (37.1 C) (Oral)   Ht 5\' 8"  (1.727 m)   Wt (!) 384 lb 9.6 oz (174.5 kg)   SpO2 99%   BMI 58.48 kg/m   Allergies  Allergen Reactions  . Penicillins Swelling    Wt Readings from Last  3 Encounters:  02/24/18 (!) 384 lb 9.6 oz (174.5 kg)  09/24/17 (!) 373 lb 6.4 oz (169.4 kg)  04/26/17 (!) 380 lb 12.8 oz (172.7 kg)    Physical Exam  Constitutional: She is oriented to person, place, and time. She appears well-developed and well-nourished.  HENT:  Head: Normocephalic and atraumatic.  Right Ear: There is drainage and tenderness.  Left Ear: There is drainage and tenderness.  Nose: Mucosal edema and rhinorrhea present. Right sinus exhibits no maxillary sinus tenderness and no frontal sinus tenderness. Left sinus exhibits no maxillary sinus tenderness and no frontal sinus tenderness.  Mouth/Throat: Oropharyngeal exudate and posterior oropharyngeal erythema present.  Eyes: Pupils are equal, round, and reactive to light. Conjunctivae and EOM are normal.   Neck: Normal range of motion. Neck supple.  Cardiovascular: Normal rate, regular rhythm, normal heart sounds and intact distal pulses.  Pulmonary/Chest: Effort normal. She has wheezes in the right upper field and the left upper field.  Abdominal: Soft. Bowel sounds are normal.  Neurological: She is alert and oriented to person, place, and time. She has normal reflexes.  Skin: Skin is warm and dry. No rash noted.  Psychiatric: She has a normal mood and affect. Her behavior is normal. Judgment and thought content normal.        Assessment & Plan:   1. Bronchitis - doxycycline (VIBRA-TABS) 100 MG tablet; Take 1 tablet (100 mg total) by mouth 2 (two) times daily. 1 po bid  Dispense: 20 tablet; Refill: 0 - fluconazole (DIFLUCAN) 150 MG tablet; 1 po q week x 4 weeks  Dispense: 4 tablet; Refill: 0 - fluticasone furoate-vilanterol (BREO ELLIPTA) 200-25 MCG/INH AEPB; Inhale 1 puff into the lungs daily.  Dispense: 14 each; Refill: 0  2. Bronchospasm   Continue all other maintenance medications as listed above.  Follow up plan: No follow-ups on file.  Educational handout given for survey  Remus Loffler PA-C [facility] [address]  [place], [place] [address] [phone]   02/24/2018, 10:47 AM

## 2018-03-12 ENCOUNTER — Ambulatory Visit: Payer: PRIVATE HEALTH INSURANCE | Admitting: Family

## 2018-03-13 ENCOUNTER — Ambulatory Visit: Payer: PRIVATE HEALTH INSURANCE | Admitting: Family

## 2018-03-13 ENCOUNTER — Encounter: Payer: Self-pay | Admitting: Family

## 2018-03-13 VITALS — BP 131/78 | HR 97 | Temp 97.3°F | Ht 68.0 in | Wt 390.0 lb

## 2018-03-13 DIAGNOSIS — J209 Acute bronchitis, unspecified: Secondary | ICD-10-CM

## 2018-03-13 DIAGNOSIS — R399 Unspecified symptoms and signs involving the genitourinary system: Secondary | ICD-10-CM

## 2018-03-13 DIAGNOSIS — R309 Painful micturition, unspecified: Secondary | ICD-10-CM | POA: Diagnosis not present

## 2018-03-13 LAB — URINALYSIS, COMPLETE
BILIRUBIN UA: NEGATIVE
Glucose, UA: NEGATIVE
Leukocytes, UA: NEGATIVE
NITRITE UA: NEGATIVE
PH UA: 6 (ref 5.0–7.5)
PROTEIN UA: NEGATIVE
Specific Gravity, UA: 1.02 (ref 1.005–1.030)
UUROB: 0.2 mg/dL (ref 0.2–1.0)

## 2018-03-13 LAB — MICROSCOPIC EXAMINATION: Renal Epithel, UA: NONE SEEN /hpf

## 2018-03-13 MED ORDER — PREDNISONE 10 MG (21) PO TBPK: ORAL_TABLET | ORAL | 0 refills | Status: DC

## 2018-03-13 MED ORDER — ALBUTEROL SULFATE (2.5 MG/3ML) 0.083% IN NEBU: 2.5000 mg | INHALATION_SOLUTION | Freq: Four times a day (QID) | RESPIRATORY_TRACT | 1 refills | Status: DC | PRN

## 2018-03-13 NOTE — Patient Instructions (Signed)

## 2018-03-13 NOTE — Progress Notes (Signed)
   Subjective:    Patient ID: Veronica [name], female    DOB: 07-12-1981, 37 y.o.   MRN: [id number]  Wheezing   This is a new problem. The current episode started 1 to 4 weeks ago. The problem occurs intermittently. The problem has been waxing and waning. Associated symptoms include headaches, rhinorrhea and shortness of breath. Pertinent negatives include no chills, coughing, ear pain, fever, sore throat or swollen glands. The symptoms are aggravated by pollens. She has tried rest and OTC cough suppressant for the symptoms. Her past medical history is significant for asthma.  Dysuria   This is a new problem. The current episode started today. The problem occurs intermittently. The problem has been waxing and waning. The quality of the pain is described as burning. The pain is mild. Pertinent negatives include no chills. She has tried increased fluids for the symptoms. The treatment provided moderate relief.      Review of Systems  Constitutional: Negative for chills and fever.  HENT: Positive for rhinorrhea. Negative for ear pain and sore throat.   Respiratory: Positive for shortness of breath and wheezing. Negative for cough.   Genitourinary: Positive for dysuria.  Neurological: Positive for headaches.  All other systems reviewed and are negative.      Objective:   Physical Exam  Constitutional: She is oriented to person, place, and time. She appears well-developed and well-nourished. No distress.  morbid obese  HENT:  Head: Normocephalic and atraumatic.  Right Ear: External ear normal.  Left Ear: External ear normal.  Nose: Nose normal.  Mouth/Throat: Oropharynx is clear and moist.  Eyes: Pupils are equal, round, and reactive to light.  Neck: Normal range of motion. Neck supple. No thyromegaly present.  Cardiovascular: Normal rate, regular rhythm, normal heart sounds and intact distal pulses.  No murmur heard. Pulmonary/Chest: Effort normal. No respiratory distress. She has  wheezes.  Abdominal: Soft. Bowel sounds are normal. She exhibits no distension. There is no tenderness.  Musculoskeletal: Normal range of motion. She exhibits no edema or tenderness.  Neurological: She is alert and oriented to person, place, and time.  Skin: Skin is warm and dry.  Psychiatric: She has a normal mood and affect. Her behavior is normal. Judgment and thought content normal.  Vitals reviewed.    BP 131/78   Pulse 97   Temp (!) 97.3 F (36.3 C) (Oral)   Ht 5\' 8"  (1.727 m)   Wt (!) 390 lb (176.9 kg)   BMI 59.30 kg/m      Assessment & Plan:  1. Pain passing urine - Urinalysis, Complete - Urine Culture  2. Acute bronchitis, unspecified organism - Take meds as prescribed - Use a cool mist humidifier  -Use saline nose sprays frequently -Force fluids -For any cough or congestion  Use plain Mucinex- regular strength or max strength is fine -For fever or aces or pains- take tylenol or ibuprofen. -Throat lozenges if help - predniSONE (STERAPRED UNI-PAK 21 TAB) 10 MG (21) TBPK tablet; Use as directed  Dispense: 21 tablet; Refill: 0 - albuterol (PROVENTIL) (2.5 MG/3ML) 0.083% nebulizer solution; Take 3 mLs (2.5 mg total) by nebulization every 6 (six) hours as needed for wheezing or shortness of breath.  Dispense: 150 mL; Refill: 1  3. UTI symptoms Force fluids AZO over the counter X2 days RTO prn Culture pending - Urine Culture    Jannifer Rodneyhristy Kanyon Bunn, FNP

## 2018-03-16 LAB — URINE CULTURE

## 2018-03-17 ENCOUNTER — Other Ambulatory Visit: Payer: Self-pay | Admitting: Family

## 2018-03-17 MED ORDER — SULFAMETHOXAZOLE-TRIMETHOPRIM 800-160 MG PO TABS: 1.0000 | ORAL_TABLET | Freq: Two times a day (BID) | ORAL | 0 refills | Status: DC

## 2018-03-19 ENCOUNTER — Encounter: Payer: Self-pay | Admitting: Family

## 2018-03-19 MED ORDER — PREDNISONE 20 MG PO TABS: ORAL_TABLET | ORAL | 0 refills | Status: DC

## 2018-03-19 NOTE — Telephone Encounter (Signed)
Please inform patient that I have sent in 1 more pack of prednisone, if she continues to not improve then she needs to be seen again

## 2018-04-08 ENCOUNTER — Encounter: Payer: Self-pay | Admitting: Family

## 2018-04-08 ENCOUNTER — Ambulatory Visit (INDEPENDENT_AMBULATORY_CARE_PROVIDER_SITE_OTHER): Payer: PRIVATE HEALTH INSURANCE

## 2018-04-08 ENCOUNTER — Ambulatory Visit: Payer: PRIVATE HEALTH INSURANCE | Admitting: Family

## 2018-04-08 VITALS — BP 122/81 | HR 88 | Temp 97.2°F | Ht 68.0 in | Wt 385.4 lb

## 2018-04-08 DIAGNOSIS — M79671 Pain in right foot: Secondary | ICD-10-CM

## 2018-04-08 MED ORDER — DICLOFENAC SODIUM 75 MG PO TBEC: 75.0000 mg | DELAYED_RELEASE_TABLET | Freq: Two times a day (BID) | ORAL | 1 refills | Status: DC

## 2018-04-08 NOTE — Patient Instructions (Signed)
Stress Fracture Stress fracture is a small break or crack in a bone. A stress fracture can be fully broken (complete) or partially broken (incomplete). The most common sites for stress fractures are the bones in the front of your feet (metatarsals), your heels (calcaneus), and the long bone of your lower leg (tibia). What are the causes? A stress fracture is caused by overuse or repetitive exercise, such as running. It happens when a bone cannot absorb any more shock because the muscles around it are weak. Stress fractures happen most commonly when:  You rapidly increase or start a new physical activity.  You use shoes that are worn out or do not fit you properly.  You exercise on a new surface.  What increases the risk? You may be at higher risk for this type of fracture if:  You have a condition that causes weak bones (osteoporosis).  You are female. Stress fractures are more likely to occur in women.  What are the signs or symptoms? The most common symptom of a stress fracture is feeling pain when you are using the affected part of your body. The pain usually goes away when you are resting. Other symptoms may include:  Swelling of the affected area.  Pain in the area when it is touched.  Decreased pain while resting.  Stress fracture pain usually develops over time. How is this diagnosed? Diagnosis may include:  Medical history and physical exam.  X-rays.  Bone scan.  MRI.  How is this treated? Treatment depends on the severity of your stress fracture. Treatment usually involves resting, icing, compression, and elevation (RICE) of the affected part of your body. Treatment may also include:  Medicines to reduce inflammation.  A cast or a walking shoe.  Crutches.  Surgery.  Follow these instructions at home: If you have a cast:  Do not stick anything inside the cast to scratch your skin. Doing that increases your risk of infection.  Check the skin around the  cast every day. Report any concerns to your health care provider. You may put lotion on dry skin around the edges of the cast. Do not apply lotion to the skin underneath the cast.  Keep the cast clean and dry.  Cover the cast with a watertight plastic bag to protect it from water while you take a bath or a shower. Do not let the cast get wet.  Do not put pressure on any part of the cast until it is fully hardened. This may take several hours. If You Have a Walking Shoe:   Wear it as directed by your health care provider. Managing pain, stiffness, and swelling  If directed, apply ice to the injured area: ? Put ice in a plastic bag. ? Place a towel between your skin and the bag. ? Leave the ice on for 20 minutes, 2-3 times per day.  Move your fingers or toes often to avoid stiffness and to lessen swelling.  Raise the injured area above the level of your heart while you are sitting or lying down. Activity  Rest as directed by your health care provider. Ask your health care provider if you may do alternative exercises, such as swimming or biking, while you are healing.  Return to your normal activities as directed by your health care provider. Ask your health care provider what activities are safe for you.  Perform range-of-motion exercises only as directed by your health care provider. Safety  Do not use the injured limb to support   yourbody weight until your health care provider says that you can. Use crutches if your health care provider tells you to do so. General instructions  Do not use any tobacco products, including cigarettes, chewing tobacco, or electronic cigarettes. Tobacco can delay bone healing. If you need help quitting, ask your health care provider.  Take medicines only as directed by your health care provider.  Keep all follow-up visits as directed by your health care provider. This is important. How is this prevented?  Only wear shoes that: ? Fit well. ? Are  not worn out.  Eat a healthy diet that contains vitamin D and calcium. This helps keeps your bones strong.  Be careful when you start a new physical activity. Give your body time to adjust.  Avoid doing only one kind of activity. Do different exercises, such as swimming and running, so that no single part of your body gets overused.  Do strength-training exercises. Contact a health care provider if:  Your pain gets worse.  You have new symptoms.  You have increased swelling. Get help right away if:  You lose feeling in the affected area. This information is not intended to replace advice given to you by your health care provider. Make sure you discuss any questions you have with your health care provider. Document Released: 01/26/2003 Document Revised: 07/04/2016 Document Reviewed: 06/10/2014 Elsevier Interactive Patient Education  2018 [employer] Inc[employer]  

## 2018-04-08 NOTE — Progress Notes (Signed)
   Subjective:    Patient ID: Veronica [name], female    DOB: March 13, 1981, 37 y.o.   MRN: [id number]  Chief Complaint  Patient presents with  . Foot Pain    Foot Pain  This is a new problem. The current episode started in the past 7 days. The problem occurs constantly. The problem has been unchanged. The symptoms are aggravated by walking (any type of pressure). She has tried rest, acetaminophen and NSAIDs for the symptoms. The treatment provided mild relief.      Review of Systems  All other systems reviewed and are negative.      Objective:   Physical Exam  Constitutional: She is oriented to person, place, and time. She appears well-developed and well-nourished. No distress.  Morbid obese  HENT:  Head: Normocephalic.  Eyes: Pupils are equal, round, and reactive to light.  Neck: Normal range of motion. Neck supple. No thyromegaly present.  Cardiovascular: Normal rate, regular rhythm, normal heart sounds and intact distal pulses.  No murmur heard. Pulmonary/Chest: Effort normal and breath sounds normal. No respiratory distress. She has no wheezes.  Abdominal: Soft. Bowel sounds are normal. She exhibits no distension. There is no tenderness.  Musculoskeletal: Normal range of motion. She exhibits tenderness. She exhibits no edema.  Pain on lateral right foot with weight bearing   Neurological: She is alert and oriented to person, place, and time. She has normal reflexes. No cranial nerve deficit.  Skin: Skin is warm and dry.  Psychiatric: She has a normal mood and affect. Her behavior is normal. Judgment and thought content normal.  Vitals reviewed.   X-ray- I do not see a stress fracture, but will let radiologists read for final, Preliminary reading by Jannifer Rodney, FNP [facility]   BP 122/81   Pulse 88   Temp (!) 97.2 F (36.2 C) (Oral)   Ht  (1.727 m)   Wt (!) 385 lb 6.4 oz (174.8 kg)   BMI 58.60 kg/m      Assessment & Plan:  Veronica [name] was seen today for foot  pain.  Diagnoses and all orders for this visit:  Right foot pain -     DG Foot Complete Right; Future  Other orders -     diclofenac (VOLTAREN) 75 MG EC tablet; Take 1 tablet (75 mg total) by mouth 2 (two) times daily.   Rest Ice Will treat as stress fracture  RTO if pain continues or does not improve  Jannifer Rodney, FNP

## 2018-08-08 ENCOUNTER — Telehealth: Payer: Self-pay | Admitting: Family

## 2018-08-08 DIAGNOSIS — N390 Urinary tract infection, site not specified: Secondary | ICD-10-CM

## 2018-08-08 MED ORDER — NITROFURANTOIN MONOHYD MACRO 100 MG PO CAPS: 100.0000 mg | ORAL_CAPSULE | Freq: Two times a day (BID) | ORAL | 0 refills | Status: DC

## 2018-08-08 NOTE — Progress Notes (Signed)

## 2018-09-02 ENCOUNTER — Ambulatory Visit: Payer: PRIVATE HEALTH INSURANCE | Admitting: Family

## 2018-09-02 ENCOUNTER — Encounter: Payer: Self-pay | Admitting: Family

## 2018-09-02 VITALS — BP 113/79 | HR 111 | Temp 101.8°F | Ht 68.0 in | Wt 352.4 lb

## 2018-09-02 DIAGNOSIS — J029 Acute pharyngitis, unspecified: Secondary | ICD-10-CM | POA: Diagnosis not present

## 2018-09-02 LAB — CULTURE, GROUP A STREP

## 2018-09-02 LAB — RAPID STREP SCREEN (MED CTR MEBANE ONLY): STREP GP A AG, IA W/REFLEX: NEGATIVE

## 2018-09-02 MED ORDER — AZITHROMYCIN 250 MG PO TABS: ORAL_TABLET | ORAL | 0 refills | Status: DC

## 2018-09-02 NOTE — Patient Instructions (Signed)

## 2018-09-02 NOTE — Progress Notes (Signed)
   Subjective:    Patient ID: Veronica [name], female    DOB: 1981/02/04, 37 y.o.   MRN: [id number]  Chief Complaint  Patient presents with  . Sinus Problem  . Sore Throat    Sinus Problem  Associated symptoms include congestion, ear pain (right), headaches, a hoarse voice and swollen glands. Pertinent negatives include no coughing or neck pain.  Sore Throat   This is a new problem. The current episode started yesterday. The problem has been gradually worsening. The maximum temperature recorded prior to her arrival was 101 - 101.9 F. The pain is at a severity of 5/10. The pain is moderate. Associated symptoms include congestion, ear pain (right), headaches, a hoarse voice, a plugged ear sensation, swollen glands and trouble swallowing. Pertinent negatives include no coughing, ear discharge or neck pain. She has tried NSAIDs and acetaminophen for the symptoms. The treatment provided mild relief.      Review of Systems  HENT: Positive for congestion, ear pain (right), hoarse voice and trouble swallowing. Negative for ear discharge.   Respiratory: Negative for cough.   Musculoskeletal: Negative for neck pain.  Neurological: Positive for headaches.  All other systems reviewed and are negative.      Objective:   Physical Exam  Constitutional: She is oriented to person, place, and time. She appears well-developed and well-nourished. No distress.  HENT:  Head: Normocephalic and atraumatic.  Right Ear: External ear normal.  Nose: Mucosal edema and rhinorrhea present.  Mouth/Throat: Posterior oropharyngeal edema and posterior oropharyngeal erythema present. Tonsils are 1+ on the right. Tonsils are 1+ on the left. Tonsillar exudate.  Eyes: Pupils are equal, round, and reactive to light.  Neck: Normal range of motion. Neck supple. No thyromegaly present.  Cardiovascular: Normal rate, regular rhythm, normal heart sounds and intact distal pulses.  No murmur heard. Pulmonary/Chest: Effort  normal and breath sounds normal. No respiratory distress. She has no wheezes.  Abdominal: Soft. Bowel sounds are normal. She exhibits no distension. There is no tenderness.  Musculoskeletal: Normal range of motion. She exhibits no edema or tenderness.  Neurological: She is alert and oriented to person, place, and time. She has normal reflexes. No cranial nerve deficit.  Skin: Skin is warm and dry.  Psychiatric: She has a normal mood and affect. Her behavior is normal. Judgment and thought content normal.  Vitals reviewed.     BP 113/79   Pulse (!) 111   Temp (!) 101.8 F (38.8 C) (Oral)   Ht 5\' 8"  (1.727 m)   Wt (!) 352 lb 6.4 oz (159.8 kg)   BMI 53.58 kg/m      Assessment & Plan:  Veronica [name] comes in today with chief complaint of Sinus Problem and Sore Throat   Diagnosis and orders addressed:  1. Sore throat - Rapid Strep Screen ([facility] ONLY)  2. Acute pharyngitis, unspecified etiology - Take meds as prescribed - Use a cool mist humidifier  -Use saline nose sprays frequently -Force fluids -For any cough or congestion  Use plain Mucinex- regular strength or max strength is fine -For fever or aces or pains- take tylenol or ibuprofen. -Throat lozenges if help -New toothbrush in 3 days RTO if symptoms worsen or do not improve  - azithromycin (ZITHROMAX) 250 MG tablet; Take 500 mg once, then 250 mg for four days  Dispense: 6 tablet; Refill: 0   Jannifer Rodney, FNP

## 2018-09-04 ENCOUNTER — Other Ambulatory Visit: Payer: Self-pay | Admitting: Family

## 2018-09-25 ENCOUNTER — Other Ambulatory Visit: Payer: Self-pay | Admitting: Physician Assistant

## 2018-09-25 MED ORDER — HYDROCORTISONE ACETATE 25 MG RE SUPP: 25.0000 mg | Freq: Two times a day (BID) | RECTAL | 0 refills | Status: DC

## 2018-11-07 ENCOUNTER — Encounter: Payer: Self-pay | Admitting: Family

## 2018-11-07 ENCOUNTER — Telehealth: Payer: Self-pay | Admitting: Family

## 2018-11-07 ENCOUNTER — Other Ambulatory Visit: Payer: Self-pay | Admitting: Physician Assistant

## 2018-11-07 DIAGNOSIS — J029 Acute pharyngitis, unspecified: Secondary | ICD-10-CM

## 2018-11-07 DIAGNOSIS — J00 Acute nasopharyngitis [common cold]: Secondary | ICD-10-CM

## 2018-11-07 MED ORDER — AZITHROMYCIN 250 MG PO TABS: ORAL_TABLET | ORAL | 0 refills | Status: DC

## 2018-11-07 MED ORDER — BENZONATATE 100 MG PO CAPS: 100.0000 mg | ORAL_CAPSULE | Freq: Three times a day (TID) | ORAL | 0 refills | Status: DC | PRN

## 2018-11-07 MED ORDER — FLUTICASONE PROPIONATE 50 MCG/ACT NA SUSP: 1.0000 | Freq: Two times a day (BID) | NASAL | 6 refills | Status: DC

## 2018-11-07 NOTE — Progress Notes (Signed)
Thank you for the details you included in the comment boxes. Those details are very helpful in determining the best course of treatment for you and help us to provide the best care. Providers prescribe antibiotics to treat infections caused by bacteria. Antibiotics are very powerful in treating bacterial infections when they are used properly. To maintain their effectiveness, they should be used only when necessary. Overuse of antibiotics has resulted in the development of superbugs that are resistant to treatment!    After careful review of your answers, I would not recommend an antibiotic for your condition.  Antibiotics are not effective against viruses and therefore should not be used to treat them. Common examples of infections caused by viruses include colds and flu.  We are sorry you are not feeling well.  Here is how we plan to help!  Based on what you have shared with me, it looks like you may have a viral upper respiratory infection or a "common cold".  Colds are caused by a large number of viruses; however, rhinovirus is the most common cause.   Symptoms of the common cold vary from person to person, with common symptoms including sore throat, cough, and malaise.  A low-grade fever of 100.4 may present, but is often uncommon.  Symptoms vary however, and are closely related to a person's age or underlying illnesses.  The most common symptoms associated with the common cold are nasal discharge or congestion, cough, sneezing, headache and pressure in the ears and face.  Cold symptoms usually persist for about 3 to 10 days, but can last up to 2 weeks.  It is important to know that colds do not cause serious illness or complications in most cases.    The common cold is transmitted from person to person, with the most common method of transmission being a person's hands.  The virus is able to live on the skin and can infect other persons for up to 2 hours after direct contact.  Also, colds are  transmitted when someone coughs or sneezes; thus, it is important to cover the mouth to reduce this risk.  To keep the spread of the common cold at bay, good hand hygiene is very important.  This is an infection that is most likely caused by a virus. There are no specific treatments for the common cold other than to help you with the symptoms until the infection runs its course.    For nasal congestion, you may use an oral decongestants such as Mucinex D or if you have glaucoma or high blood pressure use plain Mucinex.  Saline nasal spray or nasal drops can help and can safely be used as often as needed for congestion.  For your congestion, I have prescribed Fluticasone nasal spray one spray in each nostril twice a day  If you do not have a history of heart disease, hypertension, diabetes or thyroid disease, prostate/bladder issues or glaucoma, you may also use Sudafed to treat nasal congestion.  It is highly recommended that you consult with a pharmacist or your primary care physician to ensure this medication is safe for you to take.     If you have a cough, you may use cough suppressants such as Delsym and Robitussin.  If you have glaucoma or high blood pressure, you can also use Coricidin HBP.   For cough I have prescribed for you A prescription cough medication called Tessalon Perles 100 mg. You may take 1-2 capsules every 8 hours as needed for cough  If you have a sore or scratchy throat, use a saltwater gargle-  to  teaspoon of salt dissolved in a 4-ounce to 8-ounce glass of warm water.  Gargle the solution for approximately 15-30 seconds and then spit.  It is important not to swallow the solution.  You can also use throat lozenges/cough drops and Chloraseptic spray to help with throat pain or discomfort.  Warm or cold liquids can also be helpful in relieving throat pain.  For headache, pain or general discomfort, you can use Ibuprofen or Tylenol as directed.   Some authorities believe that  zinc sprays or the use of Echinacea may shorten the course of your symptoms.   HOME CARE . Only take medications as instructed by your medical team. . Be sure to drink plenty of fluids. Water is fine as well as fruit juices, sodas and electrolyte beverages. You may want to stay away from caffeine or alcohol. If you are nauseated, try taking small sips of liquids. How do you know if you are getting enough fluid? Your urine should be a pale yellow or almost colorless. . Get rest. . Taking a steamy shower or using a humidifier may help nasal congestion and ease sore throat pain. You can place a towel over your head and breathe in the steam from hot water coming from a faucet. . Using a saline nasal spray works much the same way. . Cough drops, hard candies and sore throat lozenges may ease your cough. . Avoid close contacts especially the very young and the elderly . Cover your mouth if you cough or sneeze . Always remember to wash your hands.   GET HELP RIGHT AWAY IF: . You develop worsening fever. . If your symptoms do not improve within 10 days . You develop yellow or green discharge from your nose over 3 days. . You have coughing fits . You develop a severe head ache or visual changes. . You develop shortness of breath, difficulty breathing or start having chest pain . Your symptoms persist after you have completed your treatment plan  MAKE SURE YOU   Understand these instructions.  Will watch your condition.  Will get help right away if you are not doing well or get worse.  Your e-visit answers were reviewed by a board certified advanced clinical practitioner to complete your personal care plan. Depending upon the condition, your plan could have included both over the counter or prescription medications. Please review your pharmacy choice. If there is a problem, you may call our nursing hot line at and have the prescription routed to another pharmacy. Your safety is important to  us. If you have drug allergies check your prescription carefully.   You can use MyChart to ask questions about today's visit, request a non-urgent call back, or ask for a work or school excuse for 24 hours related to this e-Visit. If it has been greater than 24 hours you will need to follow up with your provider, or enter a new e-Visit to address those concerns. You will get an e-mail in the next two days asking about your experience.  I hope that your e-visit has been valuable and will speed your recovery. Thank you for using e-visits.

## 2018-11-21 IMAGING — DX DG FOOT COMPLETE 3+V*R*
3 series · 3 of 3 positions shown · non-contrast
Comparison: Right ankle radiographs-05/07/2013

CLINICAL DATA: Right foot pain since this past [REDACTED]. No known
injury.

EXAM:
RIGHT FOOT COMPLETE - 3+ VIEW

[foot ap]
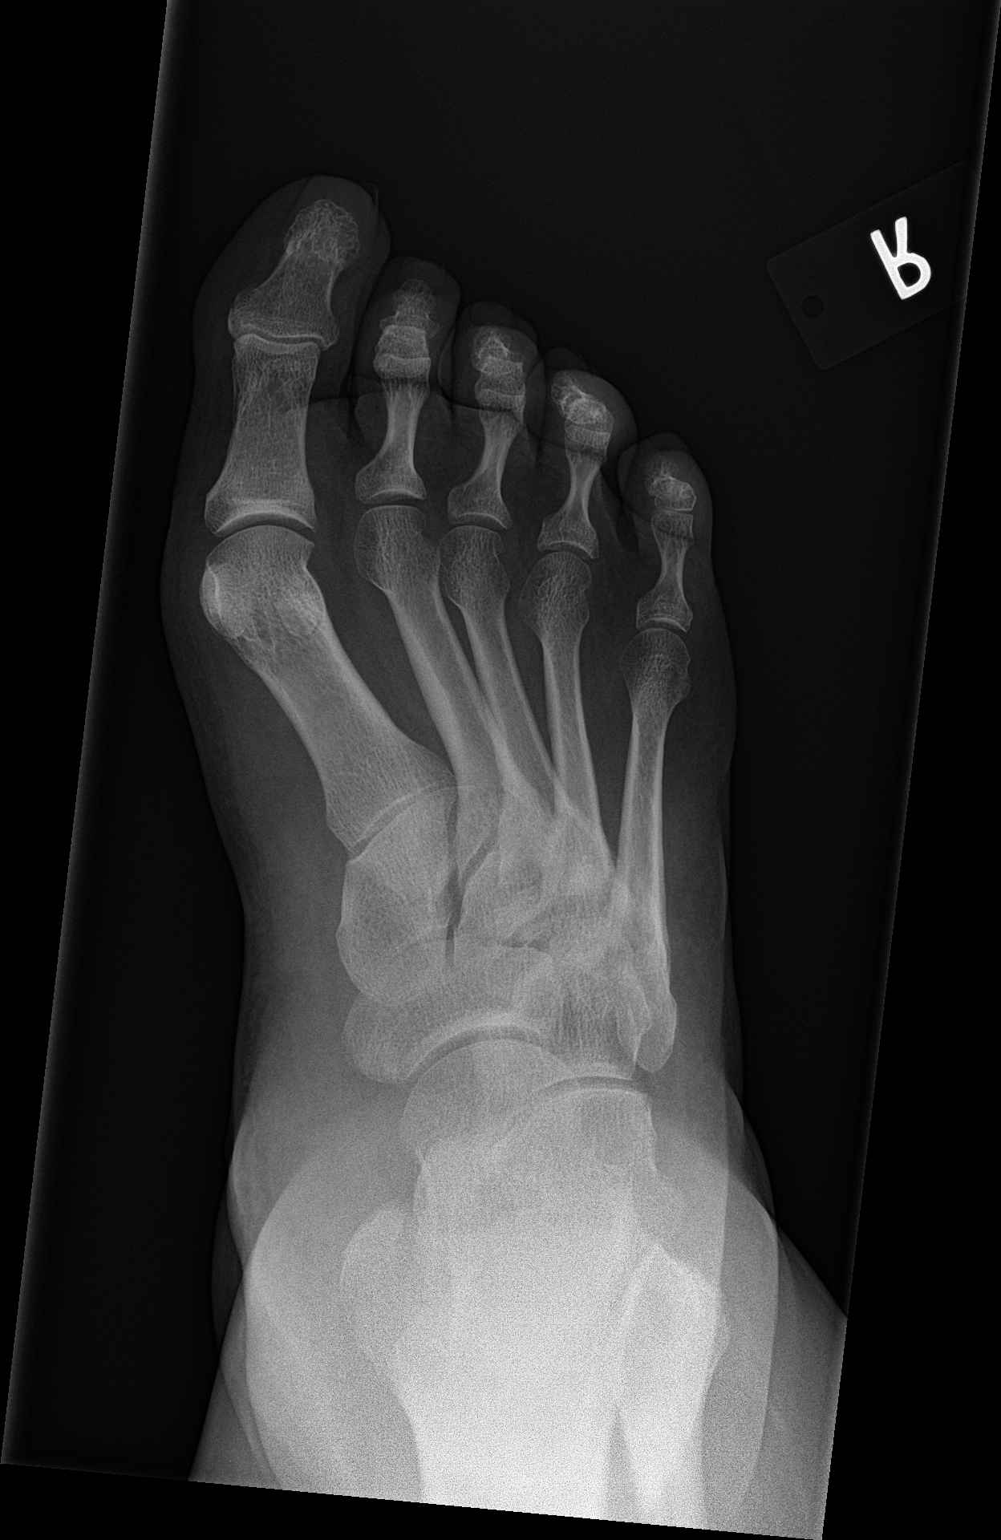

[foot obl]
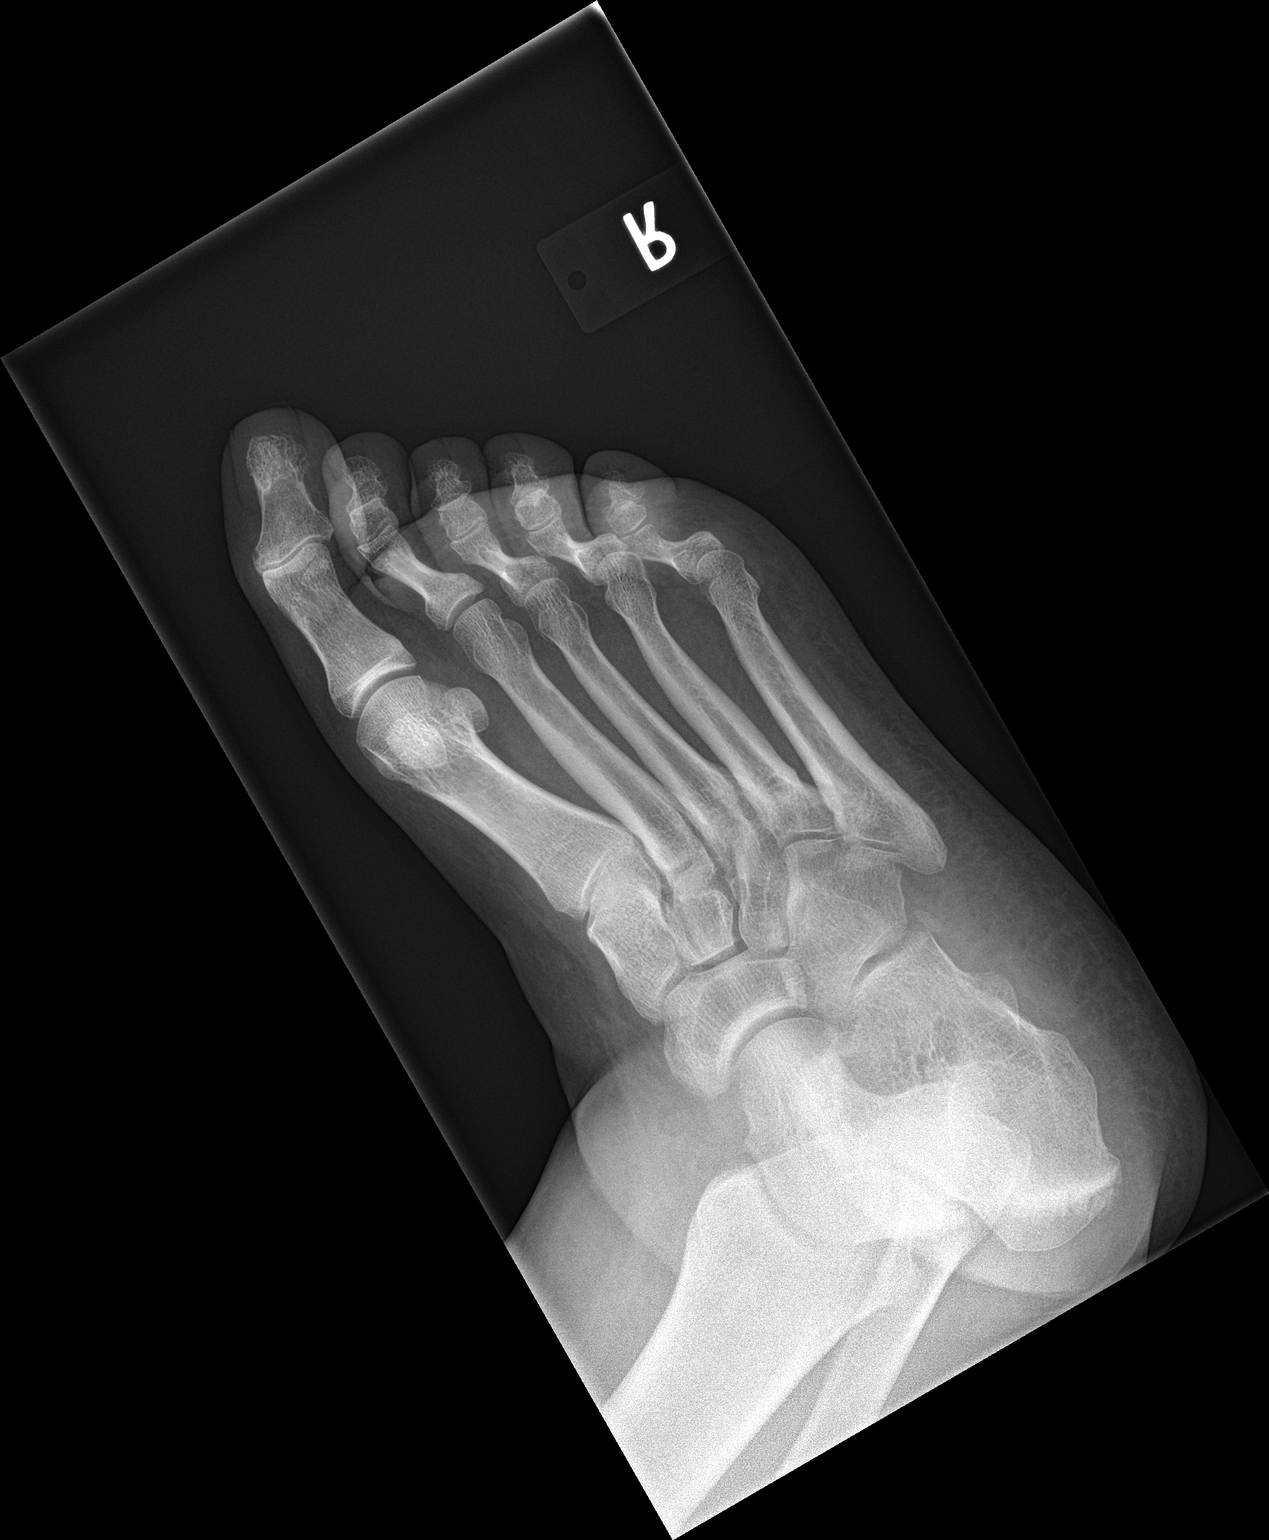

[foot lat]
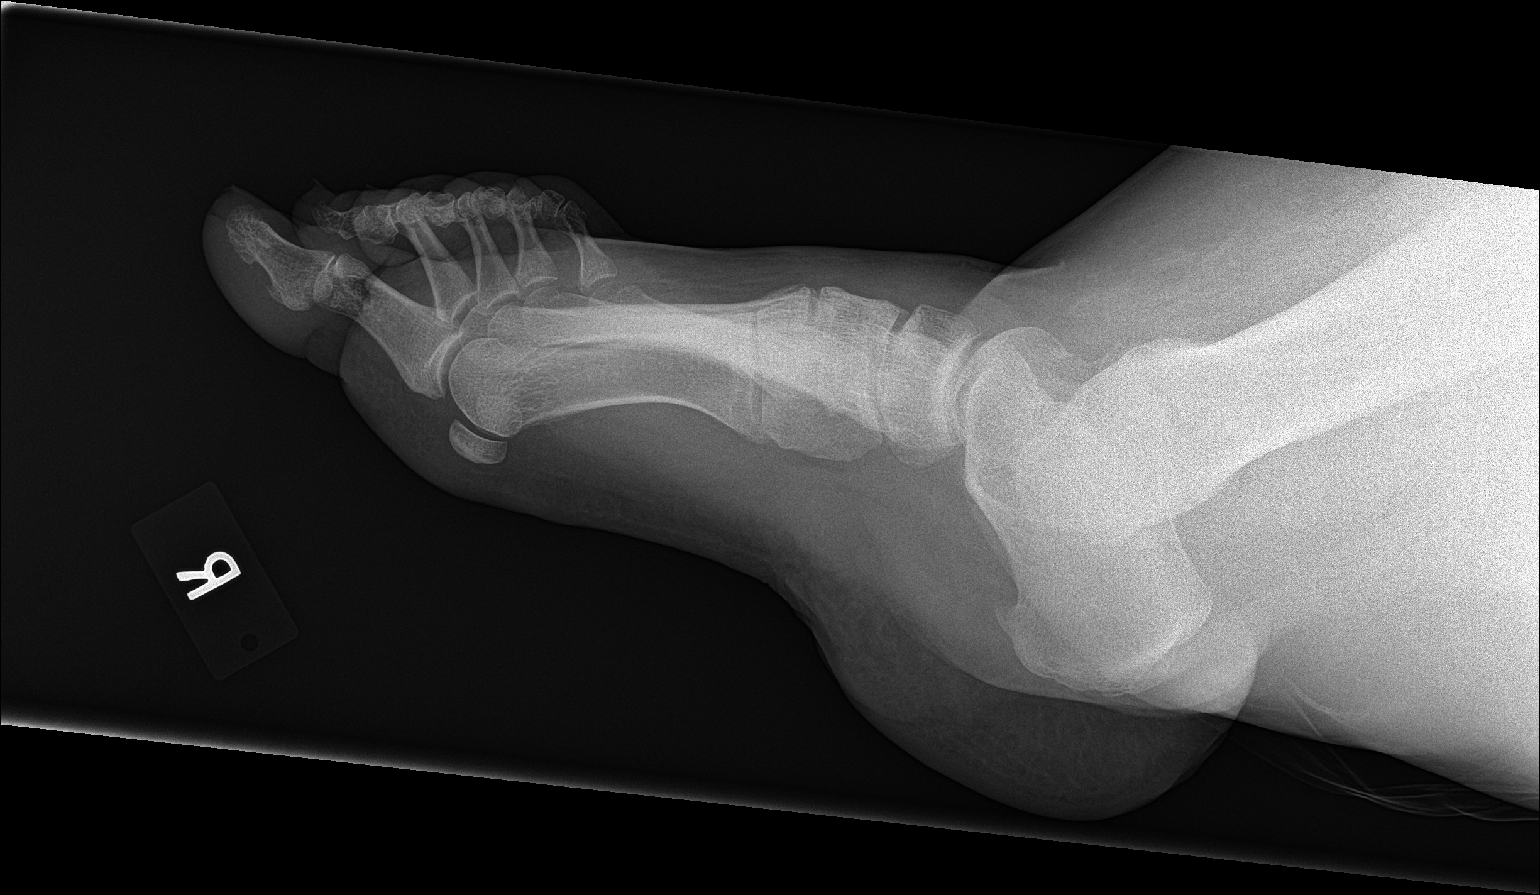

[3 of 3 positions shown; findings below may reference images not displayed]

FINDINGS: No fracture or dislocation. Moderate hallux valgus deformity without
associated degenerative change. Remaining joint spaces appear
preserved. No definite erosions. Note is made of a small os
peroneus. Small to moderate-sized plantar calcaneal spur.
Enthesopathic change involving the Achilles tendon insertion site.
Regional soft tissues appear normal.
IMPRESSION: 1. No acute findings.
2. Moderate hallux valgus deformity without associated degenerative
change.
3. Small to moderate-sized plantar calcaneal spur.

## 2018-12-08 ENCOUNTER — Other Ambulatory Visit: Payer: Self-pay | Admitting: Physician Assistant

## 2018-12-08 MED ORDER — SULFAMETHOXAZOLE-TRIMETHOPRIM 800-160 MG PO TABS: 1.0000 | ORAL_TABLET | Freq: Two times a day (BID) | ORAL | 0 refills | Status: DC

## 2018-12-26 ENCOUNTER — Other Ambulatory Visit: Payer: Self-pay | Admitting: Physician Assistant

## 2018-12-26 DIAGNOSIS — J029 Acute pharyngitis, unspecified: Secondary | ICD-10-CM

## 2018-12-26 DIAGNOSIS — J00 Acute nasopharyngitis [common cold]: Secondary | ICD-10-CM

## 2018-12-26 MED ORDER — AZITHROMYCIN 250 MG PO TABS: ORAL_TABLET | ORAL | 0 refills | Status: DC

## 2018-12-26 MED ORDER — BENZONATATE 100 MG PO CAPS: 100.0000 mg | ORAL_CAPSULE | Freq: Three times a day (TID) | ORAL | 0 refills | Status: DC | PRN

## 2018-12-30 ENCOUNTER — Ambulatory Visit: Payer: PRIVATE HEALTH INSURANCE | Admitting: Family Medicine

## 2018-12-30 ENCOUNTER — Encounter: Payer: Self-pay | Admitting: Family Medicine

## 2018-12-30 VITALS — BP 126/82 | HR 78 | Temp 98.9°F | Ht 68.0 in | Wt 332.0 lb

## 2018-12-30 DIAGNOSIS — J209 Acute bronchitis, unspecified: Secondary | ICD-10-CM | POA: Diagnosis not present

## 2018-12-30 MED ORDER — METHYLPREDNISOLONE ACETATE 80 MG/ML IJ SUSP
80.0000 mg | Freq: Once | INTRAMUSCULAR | Status: AC
  Administered 2018-12-30: 80 mg via INTRAMUSCULAR

## 2018-12-30 MED ORDER — PSEUDOEPH-BROMPHEN-DM 30-2-10 MG/5ML PO SYRP: 5.0000 mL | ORAL_SOLUTION | Freq: Four times a day (QID) | ORAL | 0 refills | Status: DC | PRN

## 2018-12-30 NOTE — Progress Notes (Signed)
    Subjective:     Veronica [name] is a 38 y.o. female here for evaluation of a cough. Onset of symptoms was several weeks ago. Symptoms have been gradually improving since that time. The cough is dry and nonproductive and is aggravated by nothing. Associated symptoms include: postnasal drip and wheezing. Patient does not have a history of asthma. Patient does not have a history of environmental allergens. Patient has not traveled recently. Patient does not have a history of smoking.  The following portions of the patient's history were reviewed and updated as appropriate: allergies, current medications, past family history, past medical history, past social history, past surgical history and problem list.  Review of Systems Constitutional: negative Eyes: negative Ears, nose, mouth, throat, and face: positive for sore throat Respiratory: positive for cough and wheezing Cardiovascular: negative Neurological: negative    Objective:    Oxygen saturation 97% on room air BP 126/82   Pulse 78   Temp 98.9 F (37.2 C) (Oral)   Ht 5\' 8"  (1.727 m)   Wt (!) 332 lb (150.6 kg)   BMI 50.48 kg/m  General appearance: alert, cooperative and no distress Head: Normocephalic, without obvious abnormality, atraumatic Eyes: negative Ears: normal TM's and external ear canals both ears Nose: mild congestion, turbinates swollen, no sinus tenderness Throat: lips, mucosa, and tongue normal; teeth and gums normal Neck: no adenopathy, no carotid bruit, no JVD, supple, symmetrical, trachea midline and thyroid not enlarged, symmetric, no tenderness/mass/nodules Lungs: wheezes posterior - bilateral bases, mild Heart: regular rate and rhythm, S1, S2 normal, no murmur, click, rub or gallop Skin: Skin color, texture, turgor normal. No rashes or lesions Neurologic: Grossly normal    Assessment:     Veronica [name] was seen today for cough and chest congestion.  Diagnoses and all orders for this visit:  Acute  bronchitis, unspecified organism Continue albuterol as needed. Medications as prescribed. Report any new or worsening symptoms.  -     methylPREDNISolone acetate (DEPO-MEDROL) injection 80 mg -     brompheniramine-pseudoephedrine-DM 30-2-10 MG/5ML syrup; Take 5 mLs by mouth 4 (four) times daily as needed.     Plan:    Explained lack of efficacy of antibiotics in viral disease. Antitussives per medication orders. Avoid exposure to tobacco smoke and fumes. B-agonist inhaler. Call if shortness of breath worsens, blood in sputum, change in character of cough, development of fever or chills, inability to maintain nutrition and hydration. Avoid exposure to tobacco smoke and fumes.    The above assessment and management plan was discussed with the patient. The patient verbalized understanding of and has agreed to the management plan. Patient is aware to call the clinic if symptoms fail to improve or worsen. Patient is aware when to return to the clinic for a follow-up visit. Patient educated on when it is appropriate to go to the emergency department.   Kari Baars, FNP-C [facility] [address] [place], [place] [address] [phone]

## 2018-12-30 NOTE — Patient Instructions (Addendum)
Acute Bronchitis, Adult Acute bronchitis is when air tubes (bronchi) in the lungs suddenly get swollen. The condition can make it hard to breathe. It can also cause these symptoms:  A cough.  Coughing up clear, yellow, or green mucus.  Wheezing.  Chest congestion.  Shortness of breath.  A fever.  Body aches.  Chills.  A sore throat. Follow these instructions at home:  Medicines  Take over-the-counter and prescription medicines only as told by your doctor.  If you were prescribed an antibiotic medicine, take it as told by your doctor. Do not stop taking the antibiotic even if you start to feel better. General instructions  Rest.  Drink enough fluids to keep your pee (urine) pale yellow.  Avoid smoking and secondhand smoke. If you smoke and you need help quitting, ask your doctor. Quitting will help your lungs heal faster.  Use an inhaler, cool mist vaporizer, or humidifier as told by your doctor.  Keep all follow-up visits as told by your doctor. This is important. How is this prevented? To lower your risk of getting this condition again:  Wash your hands often with soap and water. If you cannot use soap and water, use hand sanitizer.  Avoid contact with people who have cold symptoms.  Try not to touch your hands to your mouth, nose, or eyes.  Make sure to get the flu shot every year. Contact a doctor if:  Your symptoms do not get better in 2 weeks. Get help right away if:  You cough up blood.  You have chest pain.  You have very bad shortness of breath.  You become dehydrated.  You faint (pass out) or keep feeling like you are going to pass out.  You keep throwing up (vomiting).  You have a very bad headache.  Your fever or chills gets worse. This information is not intended to replace advice given to you by your health care provider. Make sure you discuss any questions you have with your health care provider. Document Released: 04/23/2008 Document  Revised: 06/19/2017 Document Reviewed: 04/25/2016 Elsevier Interactive Patient Education  2019 [employer]  

## 2019-03-04 ENCOUNTER — Other Ambulatory Visit: Payer: Self-pay | Admitting: Physician Assistant

## 2019-03-04 MED ORDER — AZITHROMYCIN 250 MG PO TABS: ORAL_TABLET | ORAL | 0 refills | Status: DC

## 2021-03-07 ENCOUNTER — Other Ambulatory Visit: Payer: Self-pay

## 2021-03-07 ENCOUNTER — Encounter: Payer: Self-pay | Admitting: Nurse Practitioner

## 2021-03-07 ENCOUNTER — Ambulatory Visit: Payer: PRIVATE HEALTH INSURANCE | Admitting: Nurse Practitioner

## 2021-03-07 VITALS — BP 124/89 | HR 76 | Temp 97.7°F | Ht 68.0 in | Wt 319.0 lb

## 2021-03-07 DIAGNOSIS — R55 Syncope and collapse: Secondary | ICD-10-CM | POA: Insufficient documentation

## 2021-03-07 LAB — COMPREHENSIVE METABOLIC PANEL
ALT: 19 IU/L (ref 0–32)
AST: 19 IU/L (ref 0–40)
Albumin/Globulin Ratio: 1.6 (ref 1.2–2.2)
Albumin: 4.3 g/dL (ref 3.8–4.8)
Alkaline Phosphatase: 52 IU/L (ref 44–121)
BUN/Creatinine Ratio: 16 (ref 9–23)
BUN: 14 mg/dL (ref 6–20)
Bilirubin Total: 0.3 mg/dL (ref 0.0–1.2)
CO2: 23 mmol/L (ref 20–29)
Calcium: 9.2 mg/dL (ref 8.7–10.2)
Chloride: 103 mmol/L (ref 96–106)
Creatinine, Ser: 0.85 mg/dL (ref 0.57–1.00)
Globulin, Total: 2.7 g/dL (ref 1.5–4.5)
Glucose: 88 mg/dL (ref 65–99)
Potassium: 4.1 mmol/L (ref 3.5–5.2)
Sodium: 139 mmol/L (ref 134–144)
Total Protein: 7 g/dL (ref 6.0–8.5)
eGFR: 89 mL/min/{1.73_m2} (ref >59)

## 2021-03-07 LAB — CBC WITH DIFFERENTIAL/PLATELET
Basophils Absolute: 0.1 10*3/uL (ref 0.0–0.2)
Basos: 1 %
EOS (ABSOLUTE): 0.2 10*3/uL (ref 0.0–0.4)
Eos: 3 %
Hematocrit: 41.4 % (ref 34.0–46.6)
Hemoglobin: 13.5 g/dL (ref 11.1–15.9)
Immature Grans (Abs): 0 10*3/uL (ref 0.0–0.1)
Immature Granulocytes: 0 %
Lymphocytes Absolute: 1.9 10*3/uL (ref 0.7–3.1)
Lymphs: 27 %
MCH: 28.5 pg (ref 26.6–33.0)
MCHC: 32.6 g/dL (ref 31.5–35.7)
MCV: 87 fL (ref 79–97)
Monocytes Absolute: 0.5 10*3/uL (ref 0.1–0.9)
Monocytes: 7 %
Neutrophils Absolute: 4.4 10*3/uL (ref 1.4–7.0)
Neutrophils: 62 %
Platelets: 197 10*3/uL (ref 150–450)
RBC: 4.74 x10E6/uL (ref 3.77–5.28)
RDW: 12.9 % (ref 11.7–15.4)
WBC: 7.2 10*3/uL (ref 3.4–10.8)

## 2021-03-07 NOTE — Progress Notes (Signed)
Acute Office Visit  Subjective:    Patient ID: Veronica [name], female    DOB: Dec 24, 1980, 40 y.o.   MRN: [id number]  No chief complaint on file.   Loss of Consciousness This is a new problem. Episode onset: in the past 2 days. Episode frequency: one occurance. The problem has been resolved. Length of episode of loss of consciousness: loss of consciousness. Nothing aggravates the symptoms. Pertinent negatives include no abdominal pain, auditory change, aura, back pain, bladder incontinence, fever, headaches, slurred speech or vomiting. She has tried nothing for the symptoms. Her past medical history is significant for seizures.     Past Medical History:  Diagnosis Date  . Asthma   . Former smoker   . GERD (gastroesophageal reflux disease)   . History of seizures as a child    FEBRILE--  NONE SINCE  . Incomplete spontaneous abortion 11/17/2014  . Missed ab 10/2014   D&E  . Seizures (HCC)    febrile as child  . Vaginal Pap smear, abnormal    ok since LEEP  . Wears glasses     Past Surgical History:  Procedure Laterality Date  . CESAREAN SECTION  12-05-2009  . CESAREAN SECTION N/A 09/30/2015   Procedure: CESAREAN SECTION;  Surgeon: Harold Hedge, MD;  Location: [facility];  Service: Obstetrics;  Laterality: N/A;  . DILATION AND EVACUATION N/A 11/17/2014   Procedure: DILATATION AND EVACUATION;  Surgeon: Juluis Mire, MD;  Location: [facility];  Service: Gynecology;  Laterality: N/A;  . LEEP    . WISDOM TOOTH EXTRACTION      Family History  Problem Relation Age of Onset  . Hypertension Mother   . Cancer Mother        breast  . Hyperlipidemia Father   . Hearing loss Father   . Cancer Maternal Grandmother        ?lung  . Diabetes Maternal Grandfather   . Cancer Paternal Grandmother        lung    Social History   Socioeconomic History  . Marital status: Married    Spouse name: Not on file  . Number of children: Not on file  . Years of education: Not on file  .  Highest education level: Not on file  Occupational History  . Not on file  Tobacco Use  . Smoking status: Former Games developer  . Smokeless tobacco: Never Used  . 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 Vaping Use: Never used  Substance and Sexual Activity  . Alcohol use: No  . Drug use: No  . Sexual activity: Not on file  Other Topics Concern  . Not on file  Social History Narrative  . Not on file   Social Determinants of Health   Financial Resource Strain: Not on file  Food Insecurity: Not on file  Transportation Needs: Not on file  Physical Activity: Not on file  Stress: Not on file  Social Connections: Not on file  Intimate Partner Violence: Not on file    Outpatient Medications Prior to Visit  Medication Sig Dispense Refill  . albuterol (PROVENTIL HFA;VENTOLIN HFA) 108 (90 Base) MCG/ACT inhaler Inhale 2 puffs into the lungs every 6 (six) hours as needed for wheezing or shortness of breath. 1 Inhaler 1  . albuterol (PROVENTIL) (2.5 MG/3ML) 0.083% nebulizer solution Take 3 mLs (2.5 mg total) by nebulization every 6 (six) hours as needed for wheezing or shortness of breath. 150 mL 1  . azithromycin (  ZITHROMAX Z-PAK) 250 MG tablet Take as directed 6 each 0  . benzonatate (TESSALON PERLES) 100 MG capsule Take 1-2 capsules (100-200 mg total) by mouth every 8 (eight) hours as needed for cough. 30 capsule 0  . brompheniramine-pseudoephedrine-DM 30-2-10 MG/5ML syrup Take 5 mLs by mouth 4 (four) times daily as needed. 120 mL 0  . fluticasone (FLONASE) 50 MCG/ACT nasal spray Place 1 spray into both nostrils 2 (two) times daily. 16 g 6  . hydrocortisone (ANUSOL-HC) 25 MG suppository Place 1 suppository (25 mg total) rectally 2 (two) times daily. 12 suppository 0   No facility-administered medications prior to visit.    Allergies  Allergen Reactions  . Penicillins Swelling    Review of Systems  Constitutional: Negative for fever.  Cardiovascular: Positive for  syncope.  Gastrointestinal: Negative for abdominal pain and vomiting.  Genitourinary: Negative for bladder incontinence.  Musculoskeletal: Negative for back pain.  Neurological: Negative for headaches.  All other systems reviewed and are negative.      Objective:    Physical Exam Vitals reviewed.  Constitutional:      Appearance: Normal appearance.  HENT:     Head: Normocephalic and atraumatic.     Nose: Nose normal.  Eyes:     Pupils: Pupils are equal, round, and reactive to light.  Cardiovascular:     Rate and Rhythm: Normal rate and regular rhythm.     Pulses: Normal pulses.     Heart sounds: Normal heart sounds.  Pulmonary:     Effort: Pulmonary effort is normal.     Breath sounds: Normal breath sounds.  Abdominal:     General: Bowel sounds are normal.  Musculoskeletal:        General: Normal range of motion.     Cervical back: Normal range of motion.  Skin:    General: Skin is warm.  Neurological:     Mental Status: She is alert and oriented to person, place, and time.  Psychiatric:        Mood and Affect: Mood normal.        Behavior: Behavior normal.     BP 124/89   Pulse 76   Temp 97.7 F (36.5 C) (Temporal)   Ht 5\' 8"  (1.727 m)   Wt (!) 319 lb (144.7 kg)   SpO2 100%   BMI 48.50 kg/m  Wt Readings from Last 3 Encounters:  03/07/21 (!) 319 lb (144.7 kg)  12/30/18 (!) 332 lb (150.6 kg)  09/02/18 (!) 352 lb 6.4 oz (159.8 kg)    Health Maintenance Due  Topic Date Due  . Hepatitis C Screening  Never done  . COVID-19 Vaccine (1) Never done  . PAP SMEAR-Modifier  03/08/2018    There are no preventive care reminders to display for this patient.   No results found for: TSH Lab Results  Component Value Date   WBC 9.4 10/01/2015   HGB 10.2 (L) 10/01/2015   HCT 31.3 (L) 10/01/2015   MCV 88.9 10/01/2015   PLT 197 10/01/2015   Lab Results  Component Value Date   NA 137 12/09/2009   K 3.8 12/09/2009   CO2 23 12/09/2009   GLUCOSE 122 (H)  12/09/2009   BUN 9 12/09/2009   CREATININE 0.88 12/09/2009   BILITOT 0.2 (L) 12/09/2009   ALKPHOS 111 12/09/2009   AST 55 (H) 12/09/2009   ALT 32 12/09/2009   PROT 4.8 (L) 12/09/2009   ALBUMIN 2.0 (L) 12/09/2009   CALCIUM 7.9 (L) 12/09/2009   No  results found for: CHOL No results found for: HDL No results found for: LDLCALC No results found for: TRIG No results found for: CHOLHDL No results found for: NIDP8E     Assessment & Plan:   Problem List Items Addressed This Visit      Other   Syncope - Primary    Patient reports syncopal episode 2 days ago.  Patient is unable to remember what happened or what caused it.  She did not experience any dizziness, headaches or focal deficit before incidence.  EMS was called and patient was not transferred to the hospital  after being stabilized. This is a one-time occurrence, patient does have a history of seizure as a child.  No other pertinent history.  Education provided to patient today with printed handouts given.  Completed twelve-lead EKG to rule out any cardiac abnormalities, CBC, CMP labs drawn to check for anemia.  I advised patient to increase hydration, follow-up with worsening or unresolved symptoms or another episode of syncope which will require further check and assessment with specialty.      Relevant Orders   CBC with Differential   Comprehensive metabolic panel   EKG 12-Lead (Completed)       No orders of the defined types were placed in this encounter.    Daryll Drown, NP

## 2021-03-07 NOTE — Assessment & Plan Note (Signed)
Patient reports syncopal episode 2 days ago.  Patient is unable to remember what happened or what caused it.  She did not experience any dizziness, headaches or focal deficit before incidence.  EMS was called and patient was not transferred to the hospital  after being stabilized. This is a one-time occurrence, patient does have a history of seizure as a child.  No other pertinent history.  Education provided to patient today with printed handouts given.  Completed twelve-lead EKG to rule out any cardiac abnormalities, CBC, CMP labs drawn to check for anemia.  I advised patient to increase hydration, follow-up with worsening or unresolved symptoms or another episode of syncope which will require further check and assessment with specialty.

## 2021-03-07 NOTE — Patient Instructions (Signed)
Syncope Syncope is when you pass out (faint) for a short time. It is caused by a sudden decrease in blood flow to the brain. Signs that you may be about to pass out include:  Feeling dizzy or light-headed.  Feeling sick to your stomach (nauseous).  Seeing all white or all black.  Having cold, clammy skin. If you pass out, get help right away. Call your local emergency services (911 in the [place] Do not drive yourself to the hospital. Follow these instructions at home: Watch for any changes in your symptoms. Take these actions to stay safe and help with your symptoms: Lifestyle  Do not drive, use machinery, or play sports until your doctor says it is okay.  Do not drink alcohol.  Do not use any products that contain nicotine or tobacco, such as cigarettes and e-cigarettes. If you need help quitting, ask your doctor.  Drink enough fluid to keep your pee (urine) pale yellow. General instructions  Take over-the-counter and prescription medicines only as told by your doctor.  If you are taking blood pressure or heart medicine, sit up and stand up slowly. Spend a few minutes getting ready to sit and then stand. This can help you feel less dizzy.  Have someone stay with you until you feel stable.  If you start to feel like you might pass out, lie down right away and raise (elevate) your feet above the level of your heart. Breathe deeply and steadily. Wait until all of the symptoms are gone.  Keep all follow-up visits as told by your doctor. This is important. Get help right away if:  You have a very bad headache.  You pass out once or more than once.  You have pain in your chest, belly, or back.  You have a very fast or uneven heartbeat (palpitations).  It hurts to breathe.  You are bleeding from your mouth or your bottom (rectum).  You have black or tarry poop (stool).  You have jerky movements that you cannot control (seizure).  You are confused.  You have trouble  walking.  You are very weak.  You have vision problems. These symptoms may be an emergency. Do not wait to see if the symptoms will go away. Get medical help right away. Call your local emergency services (911 in the [place] Do not drive yourself to the hospital. Summary  Syncope is when you pass out (faint) for a short time. It is caused by a sudden decrease in blood flow to the brain.  Signs that you may be about to faint include feeling dizzy, light-headed, or sick to your stomach, seeing all white or all black, or having cold, clammy skin.  If you start to feel like you might pass out, lie down right away and raise (elevate) your feet above the level of your heart. Breathe deeply and steadily. Wait until all of the symptoms are gone. This information is not intended to replace advice given to you by your health care provider. Make sure you discuss any questions you have with your health care provider. Document Revised: 12/16/2019 Document Reviewed: 12/18/2017 Elsevier Patient Education  2021 [employer] Inc[employer]  

## 2022-05-30 ENCOUNTER — Encounter: Payer: Self-pay | Admitting: Nurse Practitioner

## 2022-05-30 ENCOUNTER — Ambulatory Visit: Payer: No Typology Code available for payment source | Admitting: Nurse Practitioner

## 2022-05-30 VITALS — BP 137/84 | HR 73 | Temp 97.6°F | Ht 68.0 in | Wt 376.0 lb

## 2022-05-30 DIAGNOSIS — L731 Pseudofolliculitis barbae: Secondary | ICD-10-CM

## 2022-05-30 NOTE — Progress Notes (Signed)
Acute Office Visit  Subjective:     Patient ID: Veronica [name], female    DOB: 1981/04/18, 41 y.o.   MRN: [id number]  Chief Complaint  Patient presents with   skin check    Under right arm - painful since Sunday knot    HPI Patient is in today for patient is a 41 year old female who presents to clinic today for ingrown hair in right underarm.  Patient reports mild tenderness but above all she is concerned that it may be a lump leading to breast cancer.  Patient's mother had breast cancer that started with lumps in her underarm. patient is concerned that she may have the same thing and needed reassurance that the lump was not medically significant.  Review of Systems  Constitutional: Negative.   HENT: Negative.    Eyes: Negative.   Respiratory: Negative.    Cardiovascular: Negative.   Musculoskeletal: Negative.   Skin: Negative.  Negative for itching and rash.  Neurological: Negative.   All other systems reviewed and are negative.       Objective:    BP 137/84   Pulse 73   Temp 97.6 F (36.4 C)   Ht 5\' 8"  (1.727 m)   Wt (!) 376 lb (170.6 kg)   SpO2 98%   BMI 57.17 kg/m  BP Readings from Last 3 Encounters:  05/30/22 137/84  03/07/21 124/89  12/30/18 126/82   Wt Readings from Last 3 Encounters:  05/30/22 (!) 376 lb (170.6 kg)  03/07/21 (!) 319 lb (144.7 kg)  12/30/18 (!) 332 lb (150.6 kg)      Physical Exam Vitals and nursing note reviewed.  Constitutional:      Appearance: Normal appearance. She is obese.  HENT:     Head: Normocephalic.     Right Ear: Ear canal and external ear normal.     Left Ear: Ear canal and external ear normal.     Nose: Nose normal.     Mouth/Throat:     Mouth: Mucous membranes are moist.     Pharynx: Oropharynx is clear.  Eyes:     Conjunctiva/sclera: Conjunctivae normal.  Cardiovascular:     Rate and Rhythm: Normal rate and regular rhythm.     Pulses: Normal pulses.     Heart sounds: Normal heart sounds.  Abdominal:      General: Bowel sounds are normal.  Musculoskeletal:        General: Normal range of motion.  Lymphadenopathy:     Upper Body:     Left upper body: No axillary adenopathy.  Skin:    General: Skin is warm.     Findings: No erythema or rash.  Neurological:     General: No focal deficit present.     Mental Status: She is alert and oriented to person, place, and time.  Psychiatric:        Mood and Affect: Mood normal.        Behavior: Behavior normal.     No results found for any visits on 05/30/22.      Assessment & Plan:  On assessment patient presents with a right armpit ingrown hair.  Slight tenderness present per patient.  Education provided to patient that if symptoms are worse or swelling increases we may order an ultrasound to help reassess  symptoms.  Patient verbalized understanding and just needed reassurance. Problem List Items Addressed This Visit   None   No orders of the defined types were placed in this encounter.  No follow-ups on file.  Daryll Drown, NP

## 2022-05-30 NOTE — Patient Instructions (Signed)
Ingrown Hair  An ingrown hair is a hair that curls and re-enters the skin instead of growing straight out of the skin. An ingrown hair can develop in any part of the skin that hair is removed from. An ingrown hair may cause small pockets of infection. What are the causes? An ingrown hair may be caused by: Shaving. Tweezing. Waxing. Using a hair removal cream. What increases the risk? You are more likely to develop this condition if you have curly hair. What are the signs or symptoms? Symptoms of an ingrown hair may include: Small bumps on the skin. The bumps may be filled with pus. Pain. Itching. How is this diagnosed? An ingrown hair is diagnosed by a skin exam. How is this treated? Treatment is often not needed unless the ingrown hair has caused an infection. If needed, treatment may include: Applying prescription creams to the skin. This can help with inflammation. Applying warm compresses to the skin. This can help soften the skin. Taking antibiotic medicine. An antibiotic may be prescribed if the infection is severe. Retracting and releasing the ingrown hair tips. Follow these instructions at home: Medicines Take, apply, or use over-the-counter and prescription medicines only as told by your health care provider. This includes any prescription creams. If you were prescribed an antibiotic medicine, take it as told by your health care provider. Do not stop using the antibiotic even if you start to feel better. General instructions Do not shave irritated areas of skin. You may start shaving these areas again once the irritation has gone away. To help remove ingrown hairs on your face, you may use a facial sponge in a gentle circular motion. Do not pick or squeeze the irritated area of skin as this may cause infection and scarring. Managing pain and swelling  If directed, apply heat to the affected area as often as told by your health care provider. Use the heat source that your  health care provider recommends, such as a moist heat pack or a heating pad. Place a towel between your skin and the heat source. Leave the heat on for 20-30 minutes. Remove the heat if your skin turns bright red. This is especially important if you are unable to feel pain, heat, or cold. You may have a greater risk of getting burned. How is this prevented? Shower before shaving. Wrap areas that you are going to shave in warm, moist wraps for several minutes before shaving. The warmth and moisture help to soften the hairs and makes ingrown hairs less likely. Use thick shaving gels. Use a razor that cuts hair slightly above your skin, or use an electric shaver with a long shave setting. Shave in the direction of hair growth. Avoid making multiple razor strokes. Apply a moisturizing lotion after shaving. Summary An ingrown hair is a hair that curls and re-enters the skin instead of growing straight out of the skin. Treatment is often not needed unless the ingrown hair has caused an infection. Take, apply, or use over-the-counter and prescription medicines only as told by your health care provider. This includes any prescription creams. Do not shave irritated areas of skin. You may start shaving these areas again once the irritation has gone away. If directed, apply heat to the affected area. Use the heat source that your health care provider recommends, such as a moist heat pack or a heating pad. This information is not intended to replace advice given to you by your health care provider. Make sure you discuss any  questions you have with your health care provider. Document Revised: 08/31/2021 Document Reviewed: 08/31/2021 Elsevier Patient Education  2023 [employer].

## 2022-07-31 ENCOUNTER — Encounter: Payer: Self-pay | Admitting: Nurse Practitioner

## 2022-07-31 ENCOUNTER — Ambulatory Visit: Payer: No Typology Code available for payment source | Admitting: Nurse Practitioner

## 2022-07-31 ENCOUNTER — Other Ambulatory Visit: Payer: Self-pay | Admitting: Nurse Practitioner

## 2022-07-31 VITALS — BP 131/86 | HR 86 | Temp 98.7°F | Ht 68.0 in | Wt 378.0 lb

## 2022-07-31 DIAGNOSIS — W57XXXA Bitten or stung by nonvenomous insect and other nonvenomous arthropods, initial encounter: Secondary | ICD-10-CM | POA: Diagnosis not present

## 2022-07-31 DIAGNOSIS — S30861A Insect bite (nonvenomous) of abdominal wall, initial encounter: Secondary | ICD-10-CM

## 2022-07-31 DIAGNOSIS — S0006XA Insect bite (nonvenomous) of scalp, initial encounter: Secondary | ICD-10-CM

## 2022-07-31 NOTE — Progress Notes (Signed)
   Acute Office Visit  Subjective:     Patient ID: Veronica [name], female    DOB: 20-Apr-1981, 41 y.o.   MRN: [id number]  Chief Complaint  Patient presents with   Insect Bite    Insect bite for about a week    Rash This is a new problem. The current episode started in the past 7 days. The problem has been gradually improving since onset. The affected locations include the abdomen. The rash is characterized by dryness, itchiness and redness. She was exposed to an insect bite/sting. Pertinent negatives include no anorexia, congestion, cough, facial edema, fatigue, fever, shortness of breath or sore throat. Past treatments include nothing.     Review of Systems  Constitutional:  Negative for fatigue and fever.  HENT:  Negative for congestion and sore throat.   Eyes: Negative.   Respiratory:  Negative for cough and shortness of breath.   Cardiovascular: Negative.   Gastrointestinal: Negative.  Negative for anorexia.  Genitourinary: Negative.   Musculoskeletal: Negative.   Skin:  Positive for rash.  Psychiatric/Behavioral: Negative.          Objective:    BP 131/86   Pulse 86   Temp 98.7 F (37.1 C)   Ht 5\' 8"  (1.727 m)   Wt (!) 378 lb (171.5 kg)   LMP 07/27/2022 (Exact Date) Comment: start date  SpO2 95%   BMI 57.47 kg/m  BP Readings from Last 3 Encounters:  07/31/22 131/86  05/30/22 137/84  03/07/21 124/89      Physical Exam Vitals and nursing note reviewed.  Constitutional:      Appearance: Normal appearance.  HENT:     Head: Normocephalic.     Right Ear: External ear normal.     Left Ear: External ear normal.     Nose: Nose normal.  Cardiovascular:     Rate and Rhythm: Normal rate and regular rhythm.     Pulses: Normal pulses.     Heart sounds: Normal heart sounds.  Pulmonary:     Effort: Pulmonary effort is normal.     Breath sounds: Normal breath sounds.  Musculoskeletal:        General: Normal range of motion.  Skin:    Findings: Erythema and  rash present.          Comments: Rash present  Neurological:     Mental Status: She is alert and oriented to person, place, and time.  Psychiatric:        Behavior: Behavior normal.     No results found for any visits on 07/31/22.      Assessment & Plan:  Patient presents with rash on lower right abdominal quadrant.  Patient is not sure if it was a spider or tick bite.  Symptoms are improving.  Patient denies fever, body aches or any flulike symptoms.  No pain, tenderness or weeping.   advised patient to  wipe  with saline and apply triple antibiotic cream.  Patient wanted to make sure there was nothing more serious.  Advised patient to watch out for signs and symptoms of infection and follow-up with unresolved symptoms. Problem List Items Addressed This Visit   None   No orders of the defined types were placed in this encounter.   Return if symptoms worsen or fail to improve.  09/30/22, NP

## 2022-07-31 NOTE — Patient Instructions (Signed)
Insect Bite, Adult An insect bite can make your skin red, itchy, and swollen. Some insects can spread disease to people with a bite. However, most insect bites do not lead to disease, and most are not serious. What are the causes? Insects may bite for many reasons, including: Hunger. To defend themselves. Insects that bite include: Spiders. Mosquitoes. Flies. Ticks and fleas. Ants. Kissing bugs. Chiggers. What are the signs or symptoms? Symptoms often last for 2-4 days. However, itching can last up to 10 days. Symptoms include: Itching or pain in the bite area. Redness and swelling in the bite area. An open wound. In rare cases, a person may have a very bad allergic reaction (anaphylactic reaction) to a bite. Symptoms of an anaphylactic reaction may include: Feeling warm in the face (flushed). Your face may turn red. Itchy, red, swollen areas of skin (hives). Swelling of the eyes, lips, face, mouth, tongue, or throat. Trouble with breathing, talking, or swallowing. High-pitched whistling sounds, most often when breathing out (wheezing). Feeling dizzy or light-headed. Fainting. Pain or cramps in your belly (abdomen). Vomiting. Watery poop (diarrhea). How is this treated? Most insect bites are not serious. Symptoms often go away on their own. When treatment is advised, it may include: Putting ice on the bite area. Putting a cream or lotion, like calamine lotion, on the bite area. This helps with itching. Using medicines called antihistamines. You may also need: A tetanus shot if you are not up to date. An antibiotic cream or medicine. This treatment is needed if the bite area gets infected. Follow these instructions at home: Bite area care  Do not scratch the bite area. It may help to cover the bite area with a bandage or close-fitting clothing. Keep the bite area clean and dry. Check the bite area every day for signs of infection. Check for: More redness, swelling, or  pain. Fluid or blood. Warmth. Pus or a bad smell. Wash your hands often. Managing pain, itching, and swelling  You may put any of these on the bite area as told by your doctor: A paste made of baking soda and water. Cortisone cream. Calamine lotion. If told, put ice on the bite area. To do this: Put ice in a plastic bag. Place a towel between your skin and the bag. Leave the ice on for 20 minutes, 2-3 times a day. If your skin turns bright red, take off the ice right away to prevent skin damage. The risk of skin damage is higher if you cannot feel pain, heat, or cold. General instructions Apply or take over-the-counter and prescription medicines only as told by your doctor. If you were prescribed antibiotics, take or apply them as told by your doctor. Do not stop using them even if you start to feel better. How is this prevented? To help you have a lower risk of insect bites: When you are outside, wear clothes that cover your arms and legs. Use insect repellent. The best insect repellents contain one of these: DEET. Picaridin. Oil of lemon eucalyptus (OLE). IR3535. Consider spraying your clothing with a pesticide called permethrin. Permethrin helps prevent insect bites. It works for several weeks and for up to 5-6 clothing washes. Do not apply permethrin directly to the skin. If your home windows do not have screens, think about putting some in. If you will be sleeping in an area where there are mosquitoes, consider covering your sleeping area with a mosquito net. Contact a doctor if: You have redness, swelling, or pain   in the bite area. You have fluid or blood coming from the bite area. The bite area feels warm to the touch. You have pus or a bad smell coming from the bite area. You have a fever. Get help right away if: You have joint pain. You have a rash. You feel weak or more tired than you normally do. You have neck pain or a headache. You have signs of an anaphylactic  reaction. Signs may include: Swelling of your eyes, lips, face, mouth, tongue, or throat. Feeling warm in the face. Itchy, red, swollen areas of skin. Trouble with breathing, talking, or swallowing. Wheezing. Feeling dizzy or light-headed. Fainting. Pain or cramps in your belly. Vomiting or watery poop. These symptoms may be an emergency. Get help right away. Call 911. Do not wait to see if symptoms will go away. Do not drive yourself to the hospital. Summary An insect bite can make your skin red, itchy, and swollen. Treatment is usually not needed. Symptoms often go away on their own. Do not scratch the bite area. Keep it clean and dry. Use insect repellent to help prevent insect bites. Contact a doctor if you have signs of infection. This information is not intended to replace advice given to you by your health care provider. Make sure you discuss any questions you have with your health care provider. Document Revised: 01/30/2022 Document Reviewed: 01/30/2022 Elsevier Patient Education  2023 Elsevier Inc.  

## 2022-10-02 ENCOUNTER — Encounter: Payer: Self-pay | Admitting: Nurse Practitioner

## 2022-10-02 ENCOUNTER — Ambulatory Visit: Payer: No Typology Code available for payment source | Admitting: Nurse Practitioner

## 2022-10-02 VITALS — BP 114/71 | HR 100 | Temp 98.6°F | Ht 68.0 in | Wt 386.0 lb

## 2022-10-02 DIAGNOSIS — J029 Acute pharyngitis, unspecified: Secondary | ICD-10-CM | POA: Diagnosis not present

## 2022-10-02 MED ORDER — DOXYCYCLINE HYCLATE 100 MG PO TABS: 100.0000 mg | ORAL_TABLET | Freq: Two times a day (BID) | ORAL | 0 refills | Status: DC

## 2022-10-02 NOTE — Patient Instructions (Signed)
Sore Throat When you have a sore throat, your throat may feel: Tender. Burning. Irritated. Scratchy. Painful when you swallow. Painful when you talk. Many things can cause a sore throat, such as: An infection. Allergies. Dry air. Smoke or pollution. Radiation treatment for cancer. Gastroesophageal reflux disease (GERD). A tumor. A sore throat can be the first sign of another sickness. It can happen with other problems, like: Coughing. Sneezing. Fever. Swelling of the glands in the neck. Most sore throats go away without treatment. Follow these instructions at home:     Medicines Take over-the-counter and prescription medicines only as told by your doctor. Children often get sore throats. Do not give your child aspirin. Use throat sprays to soothe your throat as told by your health care provider. Managing pain To help with pain: Sip warm liquids, such as broth, herbal tea, or warm water. Eat or drink cold or frozen liquids, such as frozen ice pops. Rinse your mouth (gargle) with a salt water mixture 3-4 times a day or as needed. To make salt water, dissolve -1 tsp (3-6 g) of salt in 1 cup (237 mL) of warm water. Do not swallow this mixture. Suck on hard candy or throat lozenges. Put a cool-mist humidifier in your bedroom at night. Sit in the bathroom with the door closed for 5-10 minutes while you run hot water in the shower. General instructions Do not smoke or use any products that contain nicotine or tobacco. If you need help quitting, ask your doctor. Get plenty of rest. Drink enough fluid to keep your pee (urine) pale yellow. Wash your hands often for at least 20 seconds with soap and water. If soap and water are not available, use hand sanitizer. Contact a doctor if: You have a fever for more than 2-3 days. You keep having symptoms for more than 2-3 days. Your throat does not get better in 7 days. You have a fever and your symptoms suddenly get worse. Your  child who is 3 months to 3 years old has a temperature of 102.2F (39C) or higher. Get help right away if: You have trouble breathing. You cannot swallow fluids, soft foods, or your spit. You have swelling in your throat or neck that gets worse. You feel like you may vomit (nauseous) and this feeling lasts a long time. You cannot stop vomiting. These symptoms may be an emergency. Get help right away. Call your local emergency services (911 in the [place] Do not wait to see if the symptoms will go away. Do not drive yourself to the hospital. Summary A sore throat is a painful, burning, irritated, or scratchy throat. Many things can cause a sore throat. Take over-the-counter medicines only as told by your doctor. Get plenty of rest. Drink enough fluid to keep your pee (urine) pale yellow. Contact a doctor if your symptoms get worse or your sore throat does not get better within 7 days. This information is not intended to replace advice given to you by your health care provider. Make sure you discuss any questions you have with your health care provider. Document Revised: 02/01/2021 Document Reviewed: 02/01/2021 Elsevier Patient Education  2023 Elsevier Inc.  

## 2022-10-02 NOTE — Progress Notes (Signed)
Acute Office Visit  Subjective:     Patient ID: Veronica [name], female    DOB: [birth year], 41 y.o.   MRN: [id number]  Chief Complaint  Patient presents with   Sore Throat    Sore Throat  This is a new problem. The current episode started in the past 7 days. The problem has been gradually worsening. The pain is worse on the right side. There has been no fever. The pain is at a severity of 5/10. The pain is moderate. Associated symptoms include neck pain and swollen glands. Pertinent negatives include no congestion or trouble swallowing. She has tried acetaminophen for the symptoms. The treatment provided mild relief.     Review of Systems  Constitutional:  Positive for fever. Negative for chills.  HENT:  Positive for sore throat. Negative for congestion and trouble swallowing.   Eyes: Negative.   Respiratory: Negative.    Cardiovascular: Negative.   Musculoskeletal:  Positive for neck pain.  Skin: Negative.  Negative for itching and rash.  All other systems reviewed and are negative.       Objective:    BP 114/71   Pulse 100   Temp 98.6 F (37 C)   Ht 5\' 8"  (1.727 m)   Wt (!) 386 lb (175.1 kg)   SpO2 100%   BMI 58.69 kg/m  BP Readings from Last 3 Encounters:  10/02/22 114/71  07/31/22 131/86  05/30/22 137/84      Physical Exam Vitals and nursing note reviewed.  Constitutional:      Appearance: She is well-developed.  HENT:     Head: Normocephalic.     Right Ear: Ear canal normal. No swelling or tenderness.     Left Ear: Ear canal normal.     Nose: No congestion.     Mouth/Throat:     Pharynx: Uvula midline.  Eyes:     Conjunctiva/sclera: Conjunctivae normal.  Cardiovascular:     Rate and Rhythm: Normal rate and regular rhythm.     Heart sounds: Normal heart sounds.  Pulmonary:     Effort: Pulmonary effort is normal.     Breath sounds: Normal breath sounds.  Abdominal:     General: Bowel sounds are normal.     Palpations: Abdomen is soft.   Skin:    General: Skin is warm.     Findings: No erythema.  Neurological:     Mental Status: She is alert and oriented to person, place, and time.     No results found for any visits on 10/02/22.      Assessment & Plan:  Patient presents with right tonsil enlargement and right swollen lymph nodes, right neck pain.  Symptoms present for the past 5 to 6 days. Completed assessment.  Started patient on doxycycline, Tylenol/ibuprofen for pain as needed.  Follow-up with worsening unresolved symptoms.  Problem List Items Addressed This Visit   None Visit Diagnoses     Sore throat    -  Primary   Relevant Medications   doxycycline (VIBRA-TABS) 100 MG tablet       Meds ordered this encounter  Medications   doxycycline (VIBRA-TABS) 100 MG tablet    Sig: Take 1 tablet (100 mg total) by mouth 2 (two) times daily.    Dispense:  20 tablet    Refill:  0    Order Specific Question:   Supervising Provider    Answer:   10/04/22 Mechele Claude    Return if symptoms worsen or fail to  improve.  Ivy Lynn, NP

## 2022-10-05 ENCOUNTER — Other Ambulatory Visit: Payer: Self-pay | Admitting: Nurse Practitioner

## 2022-10-05 ENCOUNTER — Encounter: Payer: Self-pay | Admitting: Nurse Practitioner

## 2022-10-05 DIAGNOSIS — B379 Candidiasis, unspecified: Secondary | ICD-10-CM

## 2022-10-05 MED ORDER — FLUCONAZOLE 150 MG PO TABS: 150.0000 mg | ORAL_TABLET | Freq: Once | ORAL | 0 refills | Status: AC

## 2022-10-10 ENCOUNTER — Other Ambulatory Visit: Payer: Self-pay

## 2022-10-10 ENCOUNTER — Encounter: Payer: Self-pay | Admitting: Nurse Practitioner

## 2022-10-10 ENCOUNTER — Other Ambulatory Visit: Payer: Self-pay | Admitting: Nurse Practitioner

## 2022-10-10 DIAGNOSIS — J029 Acute pharyngitis, unspecified: Secondary | ICD-10-CM

## 2022-10-10 DIAGNOSIS — B379 Candidiasis, unspecified: Secondary | ICD-10-CM

## 2022-10-10 MED ORDER — FLUCONAZOLE 100 MG PO TABS: 100.0000 mg | ORAL_TABLET | Freq: Every day | ORAL | 0 refills | Status: DC

## 2022-10-10 MED ORDER — DOXYCYCLINE HYCLATE 100 MG PO TABS: 100.0000 mg | ORAL_TABLET | Freq: Two times a day (BID) | ORAL | 0 refills | Status: DC

## 2022-10-29 ENCOUNTER — Encounter: Payer: Self-pay | Admitting: Nurse Practitioner

## 2022-10-29 ENCOUNTER — Ambulatory Visit (INDEPENDENT_AMBULATORY_CARE_PROVIDER_SITE_OTHER): Payer: No Typology Code available for payment source

## 2022-10-29 ENCOUNTER — Ambulatory Visit: Payer: No Typology Code available for payment source | Admitting: Nurse Practitioner

## 2022-10-29 VITALS — BP 116/78 | HR 98 | Temp 98.7°F | Ht 68.0 in | Wt 377.0 lb

## 2022-10-29 DIAGNOSIS — R300 Dysuria: Secondary | ICD-10-CM

## 2022-10-29 LAB — URINALYSIS
Bilirubin, UA: NEGATIVE
Glucose, UA: NEGATIVE
Ketones, UA: NEGATIVE
Leukocytes,UA: NEGATIVE
Nitrite, UA: NEGATIVE
Protein,UA: NEGATIVE
RBC, UA: NEGATIVE
Specific Gravity, UA: 1.015 (ref 1.005–1.030)
Urobilinogen, Ur: 0.2 mg/dL (ref 0.2–1.0)
pH, UA: 7 (ref 5.0–7.5)

## 2022-10-29 MED ORDER — PHENAZOPYRIDINE HCL 95 MG PO TABS: 95.0000 mg | ORAL_TABLET | Freq: Three times a day (TID) | ORAL | 0 refills | Status: DC | PRN

## 2022-10-29 NOTE — Progress Notes (Signed)
Acute Office Visit  Subjective:     Patient ID: Veronica [name], female    DOB: [birth year], 41 y.o.   MRN: [id number]  Chief Complaint  Patient presents with   Dysuria   Urinary Frequency    Dysuria  This is a new problem. The current episode started yesterday. The problem occurs intermittently. The problem has been unchanged. The quality of the pain is described as burning. There has been no fever. Associated symptoms include flank pain and urgency. Pertinent negatives include no chills, hematuria or hesitancy. She has tried nothing for the symptoms. Her past medical history is significant for recurrent UTIs.    Review of Systems  Constitutional: Negative.  Negative for chills.  HENT: Negative.    Genitourinary:  Positive for dysuria, flank pain and urgency. Negative for hematuria and hesitancy.  Skin: Negative.  Negative for itching and rash.  All other systems reviewed and are negative.       Objective:    BP 116/78   Pulse 98   Temp 98.7 F (37.1 C)   Ht 5\' 8"  (1.727 m)   Wt (!) 377 lb (171 kg)   SpO2 96%   BMI 57.32 kg/m  BP Readings from Last 3 Encounters:  10/29/22 116/78  10/02/22 114/71  07/31/22 131/86   Wt Readings from Last 3 Encounters:  10/29/22 (!) 377 lb (171 kg)  10/02/22 (!) 386 lb (175.1 kg)  07/31/22 (!) 378 lb (171.5 kg)      Physical Exam Vitals and nursing note reviewed.  Constitutional:      Appearance: Normal appearance.  HENT:     Head: Normocephalic.     Right Ear: External ear normal.     Left Ear: External ear normal.     Nose: Nose normal.     Mouth/Throat:     Mouth: Mucous membranes are moist.     Pharynx: Oropharynx is clear.  Eyes:     Conjunctiva/sclera: Conjunctivae normal.  Cardiovascular:     Rate and Rhythm: Normal rate and regular rhythm.     Pulses: Normal pulses.     Heart sounds: Normal heart sounds.  Pulmonary:     Effort: Pulmonary effort is normal.     Breath sounds: Normal breath sounds.   Abdominal:     General: Bowel sounds are normal.     Tenderness: There is right CVA tenderness. There is no left CVA tenderness.  Neurological:     Mental Status: She is alert.     No results found for any visits on 10/29/22.      Assessment & Plan:  Patient presents with recurrent urinary tract infection.  This is a recurrent problem for patient.  Completed KUB results pending.  Advised patient to increase hydration,  UTI  vs  interstitial cystitis -urinalysis completed results pending -pyridium for pain Precaution and education provided -All questions answered -follow up with unresolved symptoms    Problem List Items Addressed This Visit   None Visit Diagnoses     Dysuria    -  Primary   Relevant Medications   phenazopyridine (PYRIDIUM) 95 MG tablet   Other Relevant Orders   Urinalysis   Urine Culture   DG Abd 1 View       Meds ordered this encounter  Medications   phenazopyridine (PYRIDIUM) 95 MG tablet    Sig: Take 1 tablet (95 mg total) by mouth 3 (three) times daily as needed for pain.    Dispense:  10 tablet  Refill:  0    Order Specific Question:   Supervising Provider    Answer:   Mechele Claude [phone]    No follow-ups on file.  Daryll Drown, NP

## 2022-10-29 NOTE — Patient Instructions (Signed)

## 2022-10-30 LAB — URINE CULTURE

## 2023-01-16 HISTORY — PX: OTHER SURGICAL HISTORY: SHX169

## 2023-05-01 ENCOUNTER — Encounter: Payer: Self-pay | Admitting: Family Medicine

## 2023-05-01 ENCOUNTER — Ambulatory Visit (INDEPENDENT_AMBULATORY_CARE_PROVIDER_SITE_OTHER): Payer: No Typology Code available for payment source | Admitting: Family Medicine

## 2023-05-01 VITALS — BP 112/75 | HR 65 | Temp 98.5°F | Ht 68.0 in | Wt 291.0 lb

## 2023-05-01 DIAGNOSIS — L659 Nonscarring hair loss, unspecified: Secondary | ICD-10-CM | POA: Diagnosis not present

## 2023-05-01 DIAGNOSIS — L700 Acne vulgaris: Secondary | ICD-10-CM | POA: Diagnosis not present

## 2023-05-01 DIAGNOSIS — J45909 Unspecified asthma, uncomplicated: Secondary | ICD-10-CM | POA: Insufficient documentation

## 2023-05-01 NOTE — Progress Notes (Signed)
New Patient Office Visit  Subjective   Patient ID: Veronica [name], female    DOB: 09/29/1981  Age: 42 y.o. MRN: [id number]  CC:  Chief Complaint  Patient presents with   Alopecia   HPI Veronica [name] presents to establish care Her main concern today is hair loss. 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 She states that she messaged her provider and they recommended she receive labs at PCP. States that her hairstylist would do her hair on Friday due to the damage.  At bariatrics office, her highest weight ws 387lbs, so she is down ~90 lbs.  In addition, she is also having acne along her chin. States that she has never had issues with acne before.  She has tried taking biotin, collagen, and pumpkin seed oil.  Denies diarrhea, constipation. States that bariatrics will not see her until August.  States that she is just now meeting her water and protein goals for her diet.   Outpatient Encounter Medications as of 05/01/2023  Medication Sig   albuterol (VENTOLIN HFA) 108 (90 Base) MCG/ACT inhaler INHALE 2 PUFFS BY MOUTH EVERY 4 TO 6 HOURS AS NEEDED FOR COUGH, WHEEZE, CHEST TIGHTNESS, OR SHORTNESS OF BREATH   Biotin 5000 MCG TABS Take by mouth.   calcium carbonate (OS-CAL) 600 MG TABS tablet Take by mouth.   Cholecalciferol (VITAMIN D3) 1.25 MG (50000 UT) CAPS Take 1 capsule by mouth once a week.   COLLAGEN PO Take by mouth.   EPINEPHrine 0.3 mg/0.3 mL IJ SOAJ injection INJECT CONTENTS OF 1 PEN AS NEEDED FOR ALLERGIC REACTION   LOMAIRA 8 MG TABS Take by mouth.   Multiple Vitamin (MULTIVITAMIN) tablet Take 1 tablet by mouth daily.   omeprazole (PRILOSEC) 40 MG capsule Take by mouth.   ondansetron (ZOFRAN-ODT) 8 MG disintegrating tablet Take by mouth.   phenazopyridine (PYRIDIUM) 95 MG tablet Take 1 tablet (95 mg total) by mouth 3 (three) times daily as needed for pain.   topiramate (TOPAMAX) 25 MG tablet Take by mouth.   tretinoin (RETIN-A) 0.025 % cream Apply topically at bedtime.    ursodiol (ACTIGALL) 300 MG capsule Take by mouth.   No facility-administered encounter medications on file as of 05/01/2023.   Past Medical History:  Diagnosis Date   Asthma    Former smoker    GERD (gastroesophageal reflux disease)    History of seizures as a child    FEBRILE--  NONE SINCE   Incomplete spontaneous abortion 11/17/2014   Missed ab 10/2014   D&E   Seizures (HCC)    febrile as child   Vaginal Pap smear, abnormal    ok since LEEP   Wears glasses     Past Surgical History:  Procedure Laterality Date   CESAREAN SECTION  12-05-2009   CESAREAN SECTION N/A 09/30/2015   Procedure: CESAREAN SECTION;  Surgeon: Harold Hedge, MD;  Location: [facility];  Service: Obstetrics;  Laterality: N/A;   DILATION AND EVACUATION N/A 11/17/2014   Procedure: DILATATION AND EVACUATION;  Surgeon: Juluis Mire, MD;  Location: [facility];  Service: Gynecology;  Laterality: N/A;   LEEP     WISDOM TOOTH EXTRACTION      Family History  Problem Relation Age of Onset   Hypertension Mother    Cancer Mother        breast   Hyperlipidemia Father    Hearing loss Father    Cancer Maternal Grandmother        ?lung  Diabetes Maternal Grandfather    Cancer Paternal Grandmother        lung    Social History   Socioeconomic History   Marital status: Married    Spouse name: Not on file   Number of children: Not on file   Years of education: Not on file   Highest education level: Not on file  Occupational History   Not on file  Tobacco Use   Smoking status: Former   Smokeless tobacco: Never   Tobacco comments:    occ smoker as young adult  Building services engineer Use: Never used  Substance and Sexual Activity   Alcohol use: No   Drug use: No   Sexual activity: Not on file  Other Topics Concern   Not on file  Social History Narrative   Not on file   Social Determinants of Health   Financial Resource Strain: Low Risk  (04/30/2023)   Overall Financial Resource Strain (CARDIA)     Difficulty of Paying Living Expenses: Not hard at all  Food Insecurity: No Food Insecurity (04/30/2023)   Hunger Vital Sign    Worried About Running Out of Food in the Last Year: Never true    Ran Out of Food in the Last Year: Never true  Transportation Needs: No Transportation Needs (04/30/2023)   PRAPARE - Administrator, Civil Service (Medical): No    Lack of Transportation (Non-Medical): No  Physical Activity: Sufficiently Active (04/30/2023)   Exercise Vital Sign    Days of Exercise per Week: 5 days    Minutes of Exercise per Session: 60 min  Stress: No Stress Concern Present (04/30/2023)   [employer] of Occupational Health - Occupational Stress Questionnaire    Feeling of Stress : Not at all  Social Connections: Unknown (04/30/2023)   Social Connection and Isolation Panel [NHANES]    Frequency of Communication with Friends and Family: More than three times a week    Frequency of Social Gatherings with Friends and Family: More than three times a week    Attends Religious Services: Patient declined    Database administrator or Organizations: Patient declined    Attends Banker Meetings: Not on file    Marital Status: Not on file  Intimate Partner Violence: Not on file    ROS As per HPI  Objective   BP 112/75   Pulse 65   Temp 98.5 F (36.9 C)   Ht 5\' 8"  (1.727 m)   Wt 291 lb (132 kg)   LMP 04/26/2023   SpO2 100%   BMI 44.25 kg/m   Physical Exam Constitutional:      General: She is awake. She is not in acute distress.    Appearance: Normal appearance. She is well-developed and well-groomed. She is morbidly obese. She is not ill-appearing, toxic-appearing or diaphoretic.  Cardiovascular:     Rate and Rhythm: Normal rate.     Pulses: Normal pulses.          Radial pulses are 2+ on the right side and 2+ on the left side.       Posterior tibial pulses are 2+ on the right side and 2+ on the left side.     Heart sounds: Normal heart sounds. No  murmur heard.    No gallop.  Pulmonary:     Effort: Pulmonary effort is normal. No respiratory distress.     Breath sounds: Normal breath sounds. No stridor. No wheezing, rhonchi or  rales.  Abdominal:     General: Bowel sounds are normal. There is no distension.     Palpations: Abdomen is soft. There is no mass.  Musculoskeletal:     Cervical back: Full passive range of motion without pain and neck supple.     Right lower leg: No edema.     Left lower leg: No edema.  Skin:    General: Skin is warm.     Capillary Refill: Capillary refill takes less than 2 seconds.     Findings: Acne present.     Comments: Small, diffuse white comedones  Thinning hair  Thin nails   Neurological:     General: No focal deficit present.     Mental Status: She is alert, oriented to person, place, and time and easily aroused. Mental status is at baseline.     GCS: GCS eye subscore is 4. GCS verbal subscore is 5. GCS motor subscore is 6.     Motor: No weakness.  Psychiatric:        Attention and Perception: Attention and perception normal.        Mood and Affect: Mood and affect normal.        Speech: Speech normal.        Behavior: Behavior normal. Behavior is cooperative.        Thought Content: Thought content normal. Thought content does not include homicidal or suicidal ideation. Thought content does not include homicidal or suicidal plan.        Cognition and Memory: Cognition and memory normal.        Judgment: Judgment normal.    Assessment & Plan:  1. Hair loss Labs as below. Will communicate results to patient once available.  Discussed continuing vitamins, increasing protein intake and starting OTC supplementation.  - Anemia Profile B - CMP14+EGFR - Lipid panel - VITAMIN D 25 Hydroxy (Vit-D Deficiency, Fractures) - Magnesium - Hormone Panel  2. Acne vulgaris Discussed OTC treatment option with patient.   3. Morbid obesity (HCC) Praised patient on her work so far. Encouraged patient  to continue to follow up with Bariatrics to continue weight loss journey.   The above assessment and management plan was discussed with the patient. The patient verbalized understanding of and has agreed to the management plan using shared-decision making. Patient is aware to call the clinic if they develop any new symptoms or if symptoms fail to improve or worsen. Patient is aware when to return to the clinic for a follow-up visit. Patient educated on when it is appropriate to go to the emergency department.   Return in about 2 months (around 07/01/2023) for CPE.   Neale Burly, DNP-FNP [facility] [address] [place], [place] [address] [phone]

## 2023-05-01 NOTE — Patient Instructions (Signed)
Vivascal Pro  If you do not notice decreased shedding or hair growth in 6 weeks then can stop

## 2023-05-02 LAB — LIPID PANEL
Chol/HDL Ratio: 2.4 ratio (ref 0.0–4.4)
HDL: 57 mg/dL (ref >39)

## 2023-05-02 LAB — ANEMIA PROFILE B
Iron: 36 ug/dL (ref 27–159)
MCHC: 32.4 g/dL (ref 31.5–35.7)
Monocytes Absolute: 0.5 10*3/uL (ref 0.1–0.9)
Platelets: 186 10*3/uL (ref 150–450)
UIBC: 259 ug/dL (ref 131–425)
Vitamin B-12: 355 pg/mL (ref 232–1245)

## 2023-05-02 LAB — CMP14+EGFR
ALT: 19 IU/L (ref 0–32)
Calcium: 9.2 mg/dL (ref 8.7–10.2)
Sodium: 141 mmol/L (ref 134–144)

## 2023-05-07 ENCOUNTER — Other Ambulatory Visit: Payer: Self-pay | Admitting: Family Medicine

## 2023-05-07 DIAGNOSIS — E538 Deficiency of other specified B group vitamins: Secondary | ICD-10-CM

## 2023-05-07 LAB — ANEMIA PROFILE B
Basos: 0 %
EOS (ABSOLUTE): 0.1 10*3/uL (ref 0.0–0.4)
Hematocrit: 39.5 % (ref 34.0–46.6)
Immature Grans (Abs): 0 10*3/uL (ref 0.0–0.1)
MCH: 28.5 pg (ref 26.6–33.0)

## 2023-05-07 LAB — CMP14+EGFR: Chloride: 103 mmol/L (ref 96–106)

## 2023-05-07 MED ORDER — FOLIC ACID 1 MG PO TABS
1.0000 mg | ORAL_TABLET | Freq: Every day | ORAL | 0 refills | Status: AC
Start: 2023-05-07 — End: ?

## 2023-05-08 LAB — HORMONE PANEL (T4,TSH,FSH,TESTT,SHBG,DHEA,ETC)
Follicle Stimulating Hormone: 20 m[IU]/mL
Sex Hormone Binding Globulin: 39.5 nmol/L
T4: 8.4 ug/dL

## 2023-05-08 LAB — CMP14+EGFR
Alkaline Phosphatase: 51 IU/L (ref 44–121)
Creatinine, Ser: 0.67 mg/dL (ref 0.57–1.00)
Potassium: 3.8 mmol/L (ref 3.5–5.2)

## 2023-05-08 LAB — ANEMIA PROFILE B: MCV: 88 fL (ref 79–97)

## 2023-05-09 LAB — ANEMIA PROFILE B
Ferritin: 18 ng/mL (ref 15–150)
Folate: <2 ng/mL — ABNORMAL LOW (ref >3.0)
Immature Granulocytes: 1 %
Iron Saturation: 12 % — ABNORMAL LOW (ref 15–55)
Monocytes: 7 %
RDW: 15.8 % — ABNORMAL HIGH (ref 11.7–15.4)
Total Iron Binding Capacity: 295 ug/dL (ref 250–450)

## 2023-05-09 LAB — CMP14+EGFR
Albumin/Globulin Ratio: 1.8
Bilirubin Total: 0.5 mg/dL (ref 0.0–1.2)
CO2: 25 mmol/L (ref 20–29)
Total Protein: 6.8 g/dL (ref 6.0–8.5)

## 2023-05-09 LAB — VITAMIN D 25 HYDROXY (VIT D DEFICIENCY, FRACTURES): Vit D, 25-Hydroxy: 31.3 ng/mL (ref 30.0–100.0)

## 2023-05-10 LAB — ANEMIA PROFILE B
Eos: 1 %
Hemoglobin: 12.8 g/dL (ref 11.1–15.9)
Neutrophils Absolute: 4.5 10*3/uL (ref 1.4–7.0)

## 2023-05-10 LAB — CMP14+EGFR
Albumin: 4.4 g/dL (ref 3.9–4.9)
BUN/Creatinine Ratio: 24 — ABNORMAL HIGH (ref 9–23)
BUN: 16 mg/dL (ref 6–24)

## 2023-05-10 LAB — LIPID PANEL: Cholesterol, Total: 136 mg/dL (ref 100–199)

## 2023-05-10 LAB — HORMONE PANEL (T4,TSH,FSH,TESTT,SHBG,DHEA,ETC)
DHEA-Sulfate, LCMS: 131 ug/dL
Free T-3: 2.8 pg/mL

## 2023-05-11 LAB — CMP14+EGFR
AST: 17 IU/L (ref 0–40)
Globulin, Total: 2.4 g/dL (ref 1.5–4.5)
Glucose: 81 mg/dL (ref 70–99)
eGFR: 113 mL/min/{1.73_m2} (ref >59)

## 2023-05-11 LAB — HORMONE PANEL (T4,TSH,FSH,TESTT,SHBG,DHEA,ETC)
Estradiol, Serum, MS: 40 pg/mL
Estrone Sulfate: 119 ng/dL
Free Testosterone, Serum: 2.4 pg/mL
Progesterone, Serum: <10 ng/dL
TSH: 1.8 uU/mL
Testosterone, Serum (Total): 17 ng/dL
Testosterone-% Free: 1.4 %
Triiodothyronine (T-3), Serum: 107 ng/dL

## 2023-05-11 LAB — ANEMIA PROFILE B
Basophils Absolute: 0 10*3/uL (ref 0.0–0.2)
Lymphocytes Absolute: 1.5 10*3/uL (ref 0.7–3.1)
Lymphs: 23 %
Neutrophils: 68 %
RBC: 4.49 x10E6/uL (ref 3.77–5.28)
Retic Ct Pct: 1.4 % (ref 0.6–2.6)
WBC: 6.6 10*3/uL (ref 3.4–10.8)

## 2023-05-11 LAB — LIPID PANEL
LDL Chol Calc (NIH): 65 mg/dL (ref 0–99)
Triglycerides: 67 mg/dL (ref 0–149)
VLDL Cholesterol Cal: 14 mg/dL (ref 5–40)

## 2023-05-11 LAB — MAGNESIUM: Magnesium: 2 mg/dL (ref 1.6–2.3)

## 2023-06-07 ENCOUNTER — Encounter: Payer: Self-pay | Admitting: Family Medicine

## 2023-06-07 ENCOUNTER — Ambulatory Visit: Payer: Managed Care, Other (non HMO) | Admitting: Family Medicine

## 2023-06-07 VITALS — BP 102/68 | HR 71 | Temp 98.0°F | Ht 68.0 in | Wt 285.0 lb

## 2023-06-07 DIAGNOSIS — M545 Low back pain, unspecified: Secondary | ICD-10-CM | POA: Diagnosis not present

## 2023-06-07 LAB — URINALYSIS, ROUTINE W REFLEX MICROSCOPIC
Bilirubin, UA: NEGATIVE
Glucose, UA: NEGATIVE
Ketones, UA: NEGATIVE
Leukocytes,UA: NEGATIVE
Nitrite, UA: NEGATIVE
Protein,UA: NEGATIVE
RBC, UA: NEGATIVE
Specific Gravity, UA: 1.02 (ref 1.005–1.030)
Urobilinogen, Ur: 0.2 mg/dL (ref 0.2–1.0)
pH, UA: 6 (ref 5.0–7.5)

## 2023-06-07 LAB — MICROSCOPIC EXAMINATION

## 2023-06-07 NOTE — Telephone Encounter (Signed)
Left message for patient to return call to discuss concern.

## 2023-06-07 NOTE — Progress Notes (Signed)
Acute Office Visit  Subjective:  Patient ID: Veronica [name], female    DOB: 02-14-1981, 42 y.o.   MRN: [id number]  Chief Complaint  Patient presents with   Dysuria   Dysuria    Patient is in today for possible UTI. States that she has low back pain. She denies itching, discharge, fever. States that it started Wednesday night. She states that she cleaned her bathroom and could have aggravated a muscle, but would like to rule out UTI as she has history of them.   Review of Systems  Genitourinary:  Positive for dysuria.   As per HPI  Objective:  BP 102/68   Pulse 71   Temp 98 F (36.7 C)   Ht 5\' 8"  (1.727 m)   Wt 285 lb (129.3 kg)   LMP 05/22/2023 (Exact Date)   SpO2 100%   BMI 43.33 kg/m   Physical Exam Constitutional:      General: She is awake. She is not in acute distress.    Appearance: Normal appearance. She is well-developed and well-groomed. She is not ill-appearing, toxic-appearing or diaphoretic.  Cardiovascular:     Rate and Rhythm: Normal rate.     Pulses: Normal pulses.          Radial pulses are 2+ on the right side and 2+ on the left side.       Posterior tibial pulses are 2+ on the right side and 2+ on the left side.     Heart sounds: Normal heart sounds. No murmur heard.    No gallop.  Pulmonary:     Effort: Pulmonary effort is normal. No respiratory distress.     Breath sounds: Normal breath sounds. No stridor. No wheezing, rhonchi or rales.  Abdominal:     General: There is no distension.     Palpations: Abdomen is soft. There is no mass.     Tenderness: There is no abdominal tenderness. There is no right CVA tenderness, left CVA tenderness, guarding or rebound.     Hernia: No hernia is present.  Musculoskeletal:     Cervical back: Full passive range of motion without pain and neck supple.     Right lower leg: No edema.     Left lower leg: No edema.  Skin:    General: Skin is warm.     Capillary Refill: Capillary refill takes less than 2  seconds.  Neurological:     General: No focal deficit present.     Mental Status: She is alert, oriented to person, place, and time and easily aroused. Mental status is at baseline.     GCS: GCS eye subscore is 4. GCS verbal subscore is 5. GCS motor subscore is 6.     Motor: No weakness.  Psychiatric:        Attention and Perception: Attention and perception normal.        Mood and Affect: Mood and affect normal.        Speech: Speech normal.        Behavior: Behavior normal. Behavior is cooperative.        Thought Content: Thought content normal. Thought content does not include homicidal or suicidal ideation. Thought content does not include homicidal or suicidal plan.        Cognition and Memory: Cognition and memory normal.        Judgment: Judgment normal.       06/07/2023    1:57 PM 05/01/2023    9:44 AM 05/30/2022  9:39 AM  Depression screen PHQ 2/9  Decreased Interest 0 0 0  Down, Depressed, Hopeless 0 0 0  PHQ - 2 Score 0 0 0  Altered sleeping 0 0   Tired, decreased energy 0 0   Change in appetite 0 0   Feeling bad or failure about yourself  0 0   Trouble concentrating 0 0   Moving slowly or fidgety/restless 0 0   Suicidal thoughts 0 0   PHQ-9 Score 0 0   Difficult doing work/chores Not difficult at all Not difficult at all       06/07/2023    1:57 PM 05/01/2023    9:44 AM  GAD 7 : Generalized Anxiety Score  Nervous, Anxious, on Edge 0 0  Control/stop worrying 0 0  Worry too much - different things 0 0  Trouble relaxing 0 0  Restless 0 0  Easily annoyed or irritable 0 0  Afraid - awful might happen 0 0  Total GAD 7 Score 0 0  Anxiety Difficulty Not difficult at all Not difficult at all   Assessment & Plan:  1. Acute midline low back pain without sciatica Reviewed UA in office with patient. No abnormalities. Will await culture to treat. Discussed with patient options for pain management. 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  - Urinalysis,  Routine w reflex microscopic; Future - Urine Culture; Future - Urinalysis, Routine w reflex microscopic - Urine Culture  The above assessment and management plan was discussed with the patient. The patient verbalized understanding of and has agreed to the management plan using shared-decision making. Patient is aware to call the clinic if they develop any new symptoms or if symptoms fail to improve or worsen. Patient is aware when to return to the clinic for a follow-up visit. Patient educated on when it is appropriate to go to the emergency department.   Return 3-6 months, for CPE.  Neale Burly, DNP-FNP [facility] [address] [place], [place] [address] [phone]

## 2023-06-09 LAB — URINE CULTURE

## 2023-06-11 NOTE — Progress Notes (Signed)
Negative urine culture. Recommend patient follow up if symptoms continue

## 2023-06-19 ENCOUNTER — Ambulatory Visit: Payer: Managed Care, Other (non HMO) | Admitting: Family Medicine

## 2023-06-26 ENCOUNTER — Ambulatory Visit: Payer: Managed Care, Other (non HMO) | Admitting: Family Medicine

## 2024-03-05 ENCOUNTER — Other Ambulatory Visit: Payer: Self-pay | Admitting: Obstetrics and Gynecology

## 2024-03-05 DIAGNOSIS — R928 Other abnormal and inconclusive findings on diagnostic imaging of breast: Secondary | ICD-10-CM

## 2024-03-10 ENCOUNTER — Ambulatory Visit
Admission: RE | Admit: 2024-03-10 | Discharge: 2024-03-10 | Disposition: A | Discharge status: Home / Self Care | Source: Ambulatory Visit | Attending: Obstetrics and Gynecology | Admitting: Obstetrics and Gynecology

## 2024-03-10 ENCOUNTER — Ambulatory Visit

## 2024-03-10 DIAGNOSIS — R928 Other abnormal and inconclusive findings on diagnostic imaging of breast: Secondary | ICD-10-CM

## 2024-03-19 ENCOUNTER — Other Ambulatory Visit: Payer: Self-pay
